# Patient Record
Sex: Female | Born: 1965 | Race: White | Hispanic: No | Smoking: Never smoker
Health system: Southern US, Community
[De-identification: ages and names within clinical notes are randomized; demographics above are authoritative.]

## PROBLEM LIST (undated history)

## (undated) DIAGNOSIS — G4700 Insomnia, unspecified: Secondary | ICD-10-CM

## (undated) DIAGNOSIS — T7840XA Allergy, unspecified, initial encounter: Secondary | ICD-10-CM

## (undated) DIAGNOSIS — J45909 Unspecified asthma, uncomplicated: Secondary | ICD-10-CM

## (undated) DIAGNOSIS — C801 Malignant (primary) neoplasm, unspecified: Secondary | ICD-10-CM

## (undated) HISTORY — DX: Unspecified asthma, uncomplicated: J45.909

## (undated) HISTORY — PX: EYE SURGERY: SHX253

## (undated) HISTORY — DX: Malignant (primary) neoplasm, unspecified: C80.1

## (undated) HISTORY — DX: Allergy, unspecified, initial encounter: T78.40XA

## (undated) HISTORY — PX: ABDOMINAL SURGERY: SHX537

## (undated) HISTORY — PX: APPENDECTOMY: SHX54

## (undated) HISTORY — PX: SKIN BIOPSY: SHX1

---

## 1983-07-13 HISTORY — PX: OVARIAN CYST SURGERY: SHX726

## 2005-07-23 DIAGNOSIS — J309 Allergic rhinitis, unspecified: Secondary | ICD-10-CM | POA: Insufficient documentation

## 2005-10-21 DIAGNOSIS — L719 Rosacea, unspecified: Secondary | ICD-10-CM | POA: Insufficient documentation

## 2006-07-12 HISTORY — PX: MELANOMA EXCISION: SHX5266

## 2007-02-03 DIAGNOSIS — C4360 Malignant melanoma of unspecified upper limb, including shoulder: Secondary | ICD-10-CM | POA: Insufficient documentation

## 2007-03-15 ENCOUNTER — Ambulatory Visit: Payer: Self-pay | Admitting: Family Medicine

## 2007-03-23 ENCOUNTER — Ambulatory Visit: Payer: Self-pay | Admitting: Family Medicine

## 2007-12-26 DIAGNOSIS — J45909 Unspecified asthma, uncomplicated: Secondary | ICD-10-CM | POA: Insufficient documentation

## 2010-11-10 HISTORY — PX: CYST EXCISION: SHX5701

## 2011-04-30 LAB — HM PAP SMEAR: HM PAP: NEGATIVE

## 2013-01-05 ENCOUNTER — Emergency Department: Payer: Self-pay | Admitting: Emergency Medicine

## 2013-01-05 LAB — COMPREHENSIVE METABOLIC PANEL
Alkaline Phosphatase: 58 U/L (ref 50–136)
Anion Gap: 6 — ABNORMAL LOW (ref 7–16)
Bilirubin,Total: 0.4 mg/dL (ref 0.2–1.0)
Calcium, Total: 8.9 mg/dL (ref 8.5–10.1)
Creatinine: 1.04 mg/dL (ref 0.60–1.30)
EGFR (African American): >60
EGFR (Non-African Amer.): >60
Glucose: 141 mg/dL — ABNORMAL HIGH (ref 65–99)
Osmolality: 282 (ref 275–301)
SGPT (ALT): 21 U/L (ref 12–78)
Sodium: 139 mmol/L (ref 136–145)

## 2013-01-05 LAB — CBC WITH DIFFERENTIAL/PLATELET
Basophil #: 0 10*3/uL (ref 0.0–0.1)
Eosinophil #: 0.1 10*3/uL (ref 0.0–0.7)
HCT: 37.1 % (ref 35.0–47.0)
HGB: 12.6 g/dL (ref 12.0–16.0)
Lymphocyte %: 31.5 %
MCH: 30.4 pg (ref 26.0–34.0)
MCV: 90 fL (ref 80–100)
Monocyte #: 0.4 x10 3/mm (ref 0.2–0.9)
Monocyte %: 6.8 %
Neutrophil #: 3.7 10*3/uL (ref 1.4–6.5)
Neutrophil %: 60.1 %
Platelet: 345 10*3/uL (ref 150–440)
RBC: 4.14 10*6/uL (ref 3.80–5.20)
RDW: 12.8 % (ref 11.5–14.5)

## 2013-01-05 LAB — LIPASE, BLOOD: Lipase: 237 U/L (ref 73–393)

## 2013-01-05 LAB — URINALYSIS, COMPLETE
Bacteria: NONE SEEN
Bilirubin,UR: NEGATIVE
Ph: 6 (ref 4.5–8.0)
Specific Gravity: 1.02 (ref 1.003–1.030)

## 2013-01-11 ENCOUNTER — Ambulatory Visit: Payer: Self-pay

## 2014-10-19 LAB — HM COLONOSCOPY: HM Colonoscopy: NORMAL

## 2014-10-29 ENCOUNTER — Ambulatory Visit
Admit: 2014-10-29 | Disposition: A | Discharge status: Home / Self Care | Payer: Self-pay | Attending: Gastroenterology | Admitting: Gastroenterology

## 2014-12-15 ENCOUNTER — Encounter: Payer: Self-pay | Admitting: Emergency Medicine

## 2014-12-15 ENCOUNTER — Emergency Department
Admission: EM | Admit: 2014-12-15 | Discharge: 2014-12-16 | Disposition: A | Discharge status: Home / Self Care | Payer: 59 | Attending: Emergency Medicine | Admitting: Emergency Medicine

## 2014-12-15 DIAGNOSIS — E860 Dehydration: Secondary | ICD-10-CM | POA: Insufficient documentation

## 2014-12-15 DIAGNOSIS — R51 Headache: Secondary | ICD-10-CM | POA: Insufficient documentation

## 2014-12-15 DIAGNOSIS — R799 Abnormal finding of blood chemistry, unspecified: Secondary | ICD-10-CM | POA: Diagnosis present

## 2014-12-15 DIAGNOSIS — N12 Tubulo-interstitial nephritis, not specified as acute or chronic: Secondary | ICD-10-CM | POA: Diagnosis not present

## 2014-12-15 HISTORY — DX: Insomnia, unspecified: G47.00

## 2014-12-15 LAB — URINALYSIS COMPLETE WITH MICROSCOPIC (ARMC ONLY)
BILIRUBIN URINE: NEGATIVE
Glucose, UA: NEGATIVE mg/dL
KETONES UR: NEGATIVE mg/dL
Nitrite: NEGATIVE
Protein, ur: 100 mg/dL — AB
Specific Gravity, Urine: 1.011 (ref 1.005–1.030)
pH: 5 (ref 5.0–8.0)

## 2014-12-15 LAB — COMPREHENSIVE METABOLIC PANEL
ALT: 14 U/L (ref 14–54)
AST: 22 U/L (ref 15–41)
Albumin: 4.1 g/dL (ref 3.5–5.0)
Alkaline Phosphatase: 80 U/L (ref 38–126)
Anion gap: 10 (ref 5–15)
BUN: 23 mg/dL — AB (ref 6–20)
CHLORIDE: 96 mmol/L — AB (ref 101–111)
CO2: 26 mmol/L (ref 22–32)
Calcium: 8.8 mg/dL — ABNORMAL LOW (ref 8.9–10.3)
Creatinine, Ser: 1.69 mg/dL — ABNORMAL HIGH (ref 0.44–1.00)
GFR calc Af Amer: 40 mL/min — ABNORMAL LOW (ref >60)
GFR calc non Af Amer: 34 mL/min — ABNORMAL LOW (ref >60)
Glucose, Bld: 150 mg/dL — ABNORMAL HIGH (ref 65–99)
Potassium: 4 mmol/L (ref 3.5–5.1)
Sodium: 132 mmol/L — ABNORMAL LOW (ref 135–145)
Total Bilirubin: 0.4 mg/dL (ref 0.3–1.2)
Total Protein: 8.2 g/dL — ABNORMAL HIGH (ref 6.5–8.1)

## 2014-12-15 LAB — CBC WITH DIFFERENTIAL/PLATELET
BASOS ABS: 0 10*3/uL (ref 0–0.1)
BASOS PCT: 0 %
Eosinophils Absolute: 0 10*3/uL (ref 0–0.7)
Eosinophils Relative: 0 %
HCT: 36.2 % (ref 35.0–47.0)
Hemoglobin: 12.1 g/dL (ref 12.0–16.0)
LYMPHS ABS: 0.7 10*3/uL — AB (ref 1.0–3.6)
LYMPHS PCT: 5 %
MCH: 31 pg (ref 26.0–34.0)
MCHC: 33.5 g/dL (ref 32.0–36.0)
MCV: 92.3 fL (ref 80.0–100.0)
Monocytes Absolute: 1 10*3/uL — ABNORMAL HIGH (ref 0.2–0.9)
Monocytes Relative: 7 %
NEUTROS ABS: 13.1 10*3/uL — AB (ref 1.4–6.5)
NEUTROS PCT: 88 %
Platelets: 256 10*3/uL (ref 150–440)
RBC: 3.92 MIL/uL (ref 3.80–5.20)
RDW: 12 % (ref 11.5–14.5)
WBC: 14.9 10*3/uL — ABNORMAL HIGH (ref 3.6–11.0)

## 2014-12-15 MED ORDER — SODIUM CHLORIDE 0.9 % IV BOLUS (SEPSIS)
1000.0000 mL | Freq: Once | INTRAVENOUS | Status: AC
  Administered 2014-12-15: 1000 mL via INTRAVENOUS

## 2014-12-15 NOTE — ED Provider Notes (Signed)
[facility] Emergency Department Provider Note  ____________________________________________  Time seen: Approximately 11:10 PM  I have reviewed the triage vital signs and the nursing notes.   HISTORY  Chief Complaint Abnormal Lab    HPI Brandy Hale is a 49 y.o. female who [place]. The patient reports that she has been sick for over a week. She reports that she developed chills 3 days ago with a MAXIMUM TEMPERATURE of 102.5. The patient reports that she was seen yesterday and given a shot for her urinary tract infection. She was told to come back today if she continued to have fevers. The patient reports that she received another shot today but when they checked blood work the doctor was concerned that she was dehydrated and had decreased kidney function so she was told to come into the hospital. The patient reports that she has not been eating much and has been drinking some. The patient does report that she has some back pain and her kidney area right greater than left. The patient denies any episodes of vomiting. She reports that initially for the symptoms she was placed on Cipro and Percocet for pain. The patient reports that her pain is approximately a 5 out of 10 in intensity. She reports that she simply came because her doctor told her to.   Past Medical History  Diagnosis Date  . Insomnia     There are no active problems to display for this patient.   Past Surgical History  Procedure Laterality Date  . Appendectomy    . Abdominal surgery    . Eye surgery    . Skin biopsy      Current Outpatient Rx  Name  Route  Sig  Dispense  Refill  . oxyCODONE-acetaminophen (PERCOCET/ROXICET) 5-325 MG per tablet   Oral   Take 1 tablet by mouth every 4 (four) hours as needed for moderate pain or severe pain.          Marland Kitchen levofloxacin (LEVAQUIN) 750 MG tablet   Oral   Take 1  tablet (750 mg total) by mouth daily.   7 tablet   0   . zolpidem (AMBIEN) 5 MG tablet   Oral   Take 1 tablet by mouth at bedtime as needed for sleep.            Allergies Review of patient's allergies indicates no known allergies.  History reviewed. No pertinent family history.  Social History History  Substance Use Topics  . Smoking status: Never Smoker   . Smokeless tobacco: Not on file  . Alcohol Use: Yes    Review of Systems Constitutional: fever and chills Eyes: No visual changes. ENT: No sore throat. Cardiovascular: Denies chest pain. Respiratory: Denies shortness of breath. Gastrointestinal: Mild abdominal pain.  no vomiting.   Genitourinary:  dysuria. Musculoskeletal:  back pain. Skin: Negative for rash. Neurological: Headaches.  10-point ROS otherwise negative.  ____________________________________________   PHYSICAL EXAM:  VITAL SIGNS: ED Triage Vitals  Enc Vitals Group     BP 12/15/14 1723 106/55 mmHg     Pulse Rate 12/15/14 1723 91     Resp 12/15/14 1723 18     Temp 12/15/14 1728 98.6 F (37 C)     Temp Source 12/15/14 1728 Oral     SpO2 12/15/14 1723 100 %     Weight 12/15/14 1723 102 lb (46.267 kg)     Height 12/15/14 1723  5\' 4"  (1.626 m)     Head Cir --      Peak Flow --      Pain Score 12/15/14 1723 0     Pain Loc --      Pain Edu? --      Excl. in [place]? --     Constitutional: Alert and oriented. Well appearing and in no acute distress. Eyes: Conjunctivae are normal. PERRL. EOMI. Head: Atraumatic. Nose: No congestion/rhinnorhea. Mouth/Throat: Mucous membranes are moist.  Oropharynx non-erythematous. Cardiovascular: Normal rate, regular rhythm. Grossly normal heart sounds.  Good peripheral circulation. Respiratory: Normal respiratory effort.  No retractions. Lungs CTAB. Gastrointestinal: Soft and nontender. No distention. Positive bowel sounds Genitourinary: Deferred Musculoskeletal: Mild bilateral CVA tenderness to  palpation Neurologic:  Normal speech and language. No gross focal neurologic deficits are appreciated.  Skin:  Skin is warm, dry and intact. No rash noted. Psychiatric: Mood and affect are normal.  ____________________________________________   LABS (all labs ordered are listed, but only abnormal results are displayed)  Labs Reviewed  CBC WITH DIFFERENTIAL/PLATELET - Abnormal; Notable for the following:    WBC 14.9 (*)    Neutro Abs 13.1 (*)    Lymphs Abs 0.7 (*)    Monocytes Absolute 1.0 (*)    All other components within normal limits  COMPREHENSIVE METABOLIC PANEL - Abnormal; Notable for the following:    Sodium 132 (*)    Chloride 96 (*)    Glucose, Bld 150 (*)    BUN 23 (*)    Creatinine, Ser 1.69 (*)    Calcium 8.8 (*)    Total Protein 8.2 (*)    GFR calc non Af Amer 34 (*)    GFR calc Af Amer 40 (*)    All other components within normal limits  URINALYSIS COMPLETEWITH MICROSCOPIC ([facility] ONLY) - Abnormal; Notable for the following:    Color, Urine YELLOW (*)    APPearance CLOUDY (*)    Hgb urine dipstick 2+ (*)    Protein, ur 100 (*)    Leukocytes, UA 3+ (*)    Bacteria, UA FEW (*)    Squamous Epithelial / LPF TOO NUMEROUS TO COUNT (*)    All other components within normal limits  URINE CULTURE   ____________________________________________  EKG  None ____________________________________________  RADIOLOGY  None ____________________________________________   PROCEDURES  Procedure(s) performed: None  Critical Care performed: No  ____________________________________________   INITIAL IMPRESSION / ASSESSMENT AND PLAN / ED COURSE  Pertinent labs & imaging results that were available during my care of the patient were reviewed by me and considered in my medical decision making (see chart for details).  The patient is a 49 year old female with a history of cystitis versus kidney infection that she has been having treated her primary care physician.  The patient was sent in today because she has a mildly elevated creatinine. The patient has been receiving antibiotics. I will give the patient liter of normal saline and reassess the patient.  The patient received 2 L of normal saline and a dose of levofloxacin orally. I will discharge the patient home to follow back up with her primary care physician in 2 days. The patient's fever is improved and her heart rate is also improved. ____________________________________________   FINAL CLINICAL IMPRESSION(S) / ED DIAGNOSES  Final diagnoses:  Pyelonephritis  Dehydration      Loney Hering, MD 12/16/14 [phone]

## 2014-12-15 NOTE — ED Notes (Signed)
Pt sent over for further eval of abnormal labs from [facility]. Pt states her wbc and kidney levels abnormal. Pt states has been running a fever.

## 2014-12-16 ENCOUNTER — Telehealth: Payer: Self-pay | Admitting: Family Medicine

## 2014-12-16 MED ORDER — LEVOFLOXACIN 250 MG PO TABS
ORAL_TABLET | ORAL | Status: AC
  Filled 2014-12-16: qty 5

## 2014-12-16 MED ORDER — LEVOFLOXACIN 750 MG PO TABS: 750.0000 mg | ORAL_TABLET | Freq: Every day | ORAL | Status: AC

## 2014-12-16 MED ORDER — SODIUM CHLORIDE 0.9 % IV BOLUS (SEPSIS)
1000.0000 mL | Freq: Once | INTRAVENOUS | Status: AC
  Administered 2014-12-16: 1000 mL via INTRAVENOUS

## 2014-12-16 MED ORDER — LEVOFLOXACIN 750 MG PO TABS
750.0000 mg | ORAL_TABLET | Freq: Once | ORAL | Status: AC
Start: 2014-12-16 — End: 2014-12-16
  Administered 2014-12-16: 750 mg via ORAL

## 2014-12-16 MED ORDER — LEVOFLOXACIN 500 MG PO TABS
ORAL_TABLET | ORAL | Status: AC
  Administered 2014-12-16: 750 mg via ORAL
  Filled 2014-12-16: qty 8

## 2014-12-16 NOTE — Telephone Encounter (Signed)
Pt is scheduled for tomorrow 12/17/14 @ 1:45. Thanks TNP

## 2014-12-16 NOTE — Telephone Encounter (Signed)
Pt was discharged from [facility] ER this morning around 2:30am and needs a f/u. The earliest I could get her in would be Friday 12/20/14. Can you work her in your schedule before then? If so, when? You have a 12 pm same day slot open Thursday 12/19/14. Thanks TNP

## 2014-12-16 NOTE — Telephone Encounter (Signed)
Called to schedule appt for PT. LMTCB Thanks TNP

## 2014-12-16 NOTE — Discharge Instructions (Signed)
Pyelonephritis, Adult Pyelonephritis is a kidney infection. In general, there are 2 main types of pyelonephritis:  Infections that come on quickly without any warning (acute pyelonephritis).  Infections that persist for a long period of time (chronic pyelonephritis). CAUSES  Two main causes of pyelonephritis are:  Bacteria traveling from the bladder to the kidney. This is a problem especially in pregnant women. The urine in the bladder can become filled with bacteria from multiple causes, including:  Inflammation of the prostate gland (prostatitis).  Sexual intercourse in females.  Bladder infection (cystitis).  Bacteria traveling from the bloodstream to the tissue part of the kidney. Problems that may increase your risk of getting a kidney infection include:  Diabetes.  Kidney stones or bladder stones.  Cancer.  Catheters placed in the bladder.  Other abnormalities of the kidney or ureter. SYMPTOMS   Abdominal pain.  Pain in the side or flank area.  Fever.  Chills.  Upset stomach.  Blood in the urine (dark urine).  Frequent urination.  Strong or persistent urge to urinate.  Burning or stinging when urinating. DIAGNOSIS  Your caregiver may diagnose your kidney infection based on your symptoms. A urine sample may also be taken. TREATMENT  In general, treatment depends on how severe the infection is.   If the infection is mild and caught early, your caregiver may treat you with oral antibiotics and send you home.  If the infection is more severe, the bacteria may have gotten into the bloodstream. This will require intravenous (IV) antibiotics and a hospital stay. Symptoms may include:  High fever.  Severe flank pain.  Shaking chills.  Even after a hospital stay, your caregiver may require you to be on oral antibiotics for a period of time.  Other treatments may be required depending upon the cause of the infection. HOME CARE INSTRUCTIONS   Take your  antibiotics as directed. Finish them even if you start to feel better.  Make an appointment to have your urine checked to make sure the infection is gone.  Drink enough fluids to keep your urine clear or pale yellow.  Take medicines for the bladder if you have urgency and frequency of urination as directed by your caregiver. SEEK IMMEDIATE MEDICAL CARE IF:   You have a fever or persistent symptoms for more than 2-3 days.  You have a fever and your symptoms suddenly get worse.  You are unable to take your antibiotics or fluids.  You develop shaking chills.  You experience extreme weakness or fainting.  There is no improvement after 2 days of treatment. MAKE SURE YOU:  Understand these instructions.  Will watch your condition.  Will get help right away if you are not doing well or get worse. Document Released: 06/28/2005 Document Revised: 12/28/2011 Document Reviewed: 12/02/2010 [facility] Patient Information 2015 [place], [place]. This information is not intended to replace advice given to you by your health care provider. Make sure you discuss any questions you have with your health care provider.  Dehydration, Adult Dehydration is when you lose more fluids from the body than you take in. Vital organs like the kidneys, brain, and heart cannot function without a proper amount of fluids and salt. Any loss of fluids from the body can cause dehydration.  CAUSES   Vomiting.  Diarrhea.  Excessive sweating.  Excessive urine output.  Fever. SYMPTOMS  Mild dehydration  Thirst.  Dry lips.  Slightly dry mouth. Moderate dehydration  Very dry mouth.  Sunken eyes.  Skin does not bounce  back quickly when lightly pinched and released.  Dark urine and decreased urine production.  Decreased tear production.  Headache. Severe dehydration  Very dry mouth.  Extreme thirst.  Rapid, weak pulse (more than 100 beats per minute at rest).  Cold hands and feet.  Not able to  sweat in spite of heat and temperature.  Rapid breathing.  Blue lips.  Confusion and lethargy.  Difficulty being awakened.  Minimal urine production.  No tears. DIAGNOSIS  Your caregiver will diagnose dehydration based on your symptoms and your exam. Blood and urine tests will help confirm the diagnosis. The diagnostic evaluation should also identify the cause of dehydration. TREATMENT  Treatment of mild or moderate dehydration can often be done at home by increasing the amount of fluids that you drink. It is best to drink small amounts of fluid more often. Drinking too much at one time can make vomiting worse. Refer to the home care instructions below. Severe dehydration needs to be treated at the hospital where you will probably be given intravenous (IV) fluids that contain water and electrolytes. HOME CARE INSTRUCTIONS   Ask your caregiver about specific rehydration instructions.  Drink enough fluids to keep your urine clear or pale yellow.  Drink small amounts frequently if you have nausea and vomiting.  Eat as you normally do.  Avoid:  Foods or drinks high in sugar.  Carbonated drinks.  Juice.  Extremely hot or cold fluids.  Drinks with caffeine.  Fatty, greasy foods.  Alcohol.  Tobacco.  Overeating.  Gelatin desserts.  Wash your hands well to avoid spreading bacteria and viruses.  Only take over-the-counter or prescription medicines for pain, discomfort, or fever as directed by your caregiver.  Ask your caregiver if you should continue all prescribed and over-the-counter medicines.  Keep all follow-up appointments with your caregiver. SEEK MEDICAL CARE IF:  You have abdominal pain and it increases or stays in one area (localizes).  You have a rash, stiff neck, or severe headache.  You are irritable, sleepy, or difficult to awaken.  You are weak, dizzy, or extremely thirsty. SEEK IMMEDIATE MEDICAL CARE IF:   You are unable to keep fluids down  or you get worse despite treatment.  You have frequent episodes of vomiting or diarrhea.  You have blood or green matter (bile) in your vomit.  You have blood in your stool or your stool looks black and tarry.  You have not urinated in 6 to 8 hours, or you have only urinated a small amount of very dark urine.  You have a fever.  You faint. MAKE SURE YOU:   Understand these instructions.  Will watch your condition.  Will get help right away if you are not doing well or get worse. Document Released: 06/28/2005 Document Revised: 09/20/2011 Document Reviewed: 02/15/2011 [facility] Patient Information 2015 [place], [place]. This information is not intended to replace advice given to you by your health care provider. Make sure you discuss any questions you have with your health care provider.

## 2014-12-16 NOTE — Telephone Encounter (Signed)
Can put her in at 1 :60 tomorrow. Thanks.

## 2014-12-17 ENCOUNTER — Ambulatory Visit (INDEPENDENT_AMBULATORY_CARE_PROVIDER_SITE_OTHER): Payer: 59 | Admitting: Family Medicine

## 2014-12-17 ENCOUNTER — Encounter: Payer: Self-pay | Admitting: Family Medicine

## 2014-12-17 VITALS — BP 106/70 | HR 72 | Temp 98.2°F | Resp 16 | Ht 64.0 in | Wt 107.0 lb

## 2014-12-17 DIAGNOSIS — G4700 Insomnia, unspecified: Secondary | ICD-10-CM | POA: Insufficient documentation

## 2014-12-17 DIAGNOSIS — M2040 Other hammer toe(s) (acquired), unspecified foot: Secondary | ICD-10-CM | POA: Insufficient documentation

## 2014-12-17 DIAGNOSIS — N12 Tubulo-interstitial nephritis, not specified as acute or chronic: Secondary | ICD-10-CM

## 2014-12-17 DIAGNOSIS — N912 Amenorrhea, unspecified: Secondary | ICD-10-CM | POA: Insufficient documentation

## 2014-12-17 DIAGNOSIS — Z833 Family history of diabetes mellitus: Secondary | ICD-10-CM | POA: Insufficient documentation

## 2014-12-17 DIAGNOSIS — I7300 Raynaud's syndrome without gangrene: Secondary | ICD-10-CM | POA: Insufficient documentation

## 2014-12-17 DIAGNOSIS — Z22321 Carrier or suspected carrier of Methicillin susceptible Staphylococcus aureus: Secondary | ICD-10-CM | POA: Insufficient documentation

## 2014-12-17 DIAGNOSIS — R42 Dizziness and giddiness: Secondary | ICD-10-CM | POA: Insufficient documentation

## 2014-12-17 DIAGNOSIS — H8110 Benign paroxysmal vertigo, unspecified ear: Secondary | ICD-10-CM | POA: Insufficient documentation

## 2014-12-17 DIAGNOSIS — Z332 Encounter for elective termination of pregnancy: Secondary | ICD-10-CM | POA: Insufficient documentation

## 2014-12-17 NOTE — Progress Notes (Signed)
   Subjective:    Patient ID: Brandy Hale, female    DOB: March 24, 1966, 49 y.o.   MRN: [id number]  HPI Comments: Pt is coming in today for HFU. Pt was seen on 12/15/2014 at [facility] ED. Pt was diagnosed with pyelonephritis and dehydration. Pt was put on Levofloxacin 750 mg for 7 days. Pt has only taken 1 dose of the abx. States her sx have improved slightly (chills). Pt states that if she drinks a lot of fluids, her right flank becomes tender. Pt states she drinks 2-3 20 oz bottles of water daily.     Review of Systems  Constitutional: Positive for appetite change and fatigue. Negative for fever, chills, diaphoresis, activity change and unexpected weight change.  Respiratory: Negative for cough, chest tightness, shortness of breath, wheezing and stridor.   Cardiovascular: Negative for chest pain, palpitations and leg swelling.  Genitourinary: Positive for frequency and flank pain. Negative for dysuria and difficulty urinating.    Past Medical History  Diagnosis Date  . Insomnia   . Allergy   . Asthma    Past Surgical History  Procedure Laterality Date  . Appendectomy    . Abdominal surgery    . Eye surgery    . Skin biopsy    . Melanoma excision  2008  . Cyst excision Left 11/2010    under armpit  . Ovarian cyst surgery  1985    reports that she has never smoked. She has never used smokeless tobacco. She reports that she drinks alcohol. She reports that she does not use illicit drugs. family history includes Hyperlipidemia in her mother; Hypertension in her mother. No Known Allergies  Current Outpatient Prescriptions on File Prior to Visit  Medication Sig Dispense Refill  . levofloxacin (LEVAQUIN) 750 MG tablet Take 1 tablet (750 mg total) by mouth daily. 7 tablet 0  . oxyCODONE-acetaminophen (PERCOCET/ROXICET) 5-325 MG per tablet Take 1 tablet by mouth every 4 (four) hours as needed for moderate pain or severe pain.     Marland Kitchen zolpidem (AMBIEN) 5 MG tablet Take 1 tablet by mouth  at bedtime as needed for sleep.      No current facility-administered medications on file prior to visit.      Objective:   Physical Exam  Constitutional: She appears well-developed and well-nourished.  Cardiovascular: Normal rate and regular rhythm.   Pulmonary/Chest: Breath sounds normal.  Abdominal: Soft. Bowel sounds are normal. There is CVA tenderness (mild bilateral).     BP 106/70 mmHg  Pulse 72  Temp(Src) 98.2 F (36.8 C) (Oral)  Resp 16  Ht 5\' 4"  (1.626 m)  Wt 107 lb (48.535 kg)  BMI 18.36 kg/m2       Assessment & Plan:     1. Pyelonephritis  Improving but still not feeling great. Continue Levaquin, await culture,  And schedule renal ultrasound secondary history of childhood urine problems and some time of procedure.  Please call back if condition worsens or does not continue to improve.   - COMPLETE METABOLIC PANEL WITH GFR - CBC with Differential/Platelet - US Renal; Future

## 2014-12-18 ENCOUNTER — Telehealth: Payer: Self-pay

## 2014-12-18 LAB — CMP14+EGFR
A/G RATIO: 1.5 (ref 1.1–2.5)
ALK PHOS: 101 IU/L (ref 39–117)
ALT: 25 IU/L (ref 0–32)
AST: 37 IU/L (ref 0–40)
Albumin: 4.2 g/dL (ref 3.5–5.5)
BUN/Creatinine Ratio: 11 (ref 9–23)
BUN: 10 mg/dL (ref 6–24)
Bilirubin Total: 0.3 mg/dL (ref 0.0–1.2)
CO2: 23 mmol/L (ref 18–29)
Calcium: 9.2 mg/dL (ref 8.7–10.2)
Chloride: 97 mmol/L (ref 97–108)
Creatinine, Ser: 0.88 mg/dL (ref 0.57–1.00)
GFR calc Af Amer: 89 mL/min/{1.73_m2} (ref >59)
GFR, EST NON AFRICAN AMERICAN: 77 mL/min/{1.73_m2} (ref >59)
GLOBULIN, TOTAL: 2.8 g/dL (ref 1.5–4.5)
GLUCOSE: 98 mg/dL (ref 65–99)
POTASSIUM: 3.6 mmol/L (ref 3.5–5.2)
Sodium: 139 mmol/L (ref 134–144)
TOTAL PROTEIN: 7 g/dL (ref 6.0–8.5)

## 2014-12-18 LAB — CBC WITH DIFFERENTIAL/PLATELET
Basophils Absolute: 0 10*3/uL (ref 0.0–0.2)
Basos: 0 %
EOS (ABSOLUTE): 0.1 10*3/uL (ref 0.0–0.4)
Eos: 1 %
Hematocrit: 34.5 % (ref 34.0–46.6)
Hemoglobin: 11.6 g/dL (ref 11.1–15.9)
IMMATURE GRANS (ABS): 0 10*3/uL (ref 0.0–0.1)
IMMATURE GRANULOCYTES: 0 %
Lymphocytes Absolute: 1.4 10*3/uL (ref 0.7–3.1)
Lymphs: 23 %
MCH: 30.5 pg (ref 26.6–33.0)
MCHC: 33.6 g/dL (ref 31.5–35.7)
MCV: 91 fL (ref 79–97)
Monocytes Absolute: 0.8 10*3/uL (ref 0.1–0.9)
Monocytes: 13 %
NEUTROS PCT: 63 %
Neutrophils Absolute: 3.8 10*3/uL (ref 1.4–7.0)
PLATELETS: 356 10*3/uL (ref 150–379)
RBC: 3.8 x10E6/uL (ref 3.77–5.28)
RDW: 13 % (ref 12.3–15.4)
WBC: 6 10*3/uL (ref 3.4–10.8)

## 2014-12-18 NOTE — Telephone Encounter (Signed)
Informed pt. Expresses verbal understanding. Renaldo Fiddler, CMA

## 2014-12-18 NOTE — Telephone Encounter (Signed)
LMTCB 12/18/2014  Thanks,   -Mickel Baas

## 2014-12-18 NOTE — Telephone Encounter (Signed)
-----   Message from Margarita Rana, MD sent at 12/18/2014  6:23 AM EDT ----- Labs normal. Good sign that taking the correct medication. Please notify patient.  Thanks.

## 2014-12-23 ENCOUNTER — Ambulatory Visit: Admission: RE | Admit: 2014-12-23 | Payer: 59 | Source: Ambulatory Visit

## 2015-01-01 ENCOUNTER — Ambulatory Visit
Admission: RE | Admit: 2015-01-01 | Discharge: 2015-01-01 | Disposition: A | Discharge status: Home / Self Care | Payer: 59 | Source: Ambulatory Visit | Attending: Family Medicine | Admitting: Family Medicine

## 2015-01-01 DIAGNOSIS — N12 Tubulo-interstitial nephritis, not specified as acute or chronic: Secondary | ICD-10-CM | POA: Insufficient documentation

## 2015-01-03 ENCOUNTER — Telehealth: Payer: Self-pay

## 2015-01-03 ENCOUNTER — Telehealth: Payer: Self-pay | Admitting: Family Medicine

## 2015-01-03 NOTE — Telephone Encounter (Signed)
Pt advised as directed below.   Thanks,   -Maly Lemarr  

## 2015-01-03 NOTE — Telephone Encounter (Signed)
-----   Message from Margarita Rana, MD sent at 01/01/2015  8:41 PM EDT ----- Normal renal ultrasound. Please notify patient. Thanks.

## 2015-01-03 NOTE — Telephone Encounter (Signed)
Pt returning call.  OE#[phone]/YN

## 2015-01-03 NOTE — Telephone Encounter (Signed)
LMTCB 01/03/2015  Thanks,   -Hadrian Yarbrough  

## 2015-05-20 ENCOUNTER — Other Ambulatory Visit: Payer: Self-pay

## 2015-05-20 DIAGNOSIS — G4700 Insomnia, unspecified: Secondary | ICD-10-CM

## 2015-05-22 ENCOUNTER — Other Ambulatory Visit: Payer: Self-pay

## 2015-05-22 DIAGNOSIS — G4700 Insomnia, unspecified: Secondary | ICD-10-CM

## 2015-05-22 MED ORDER — ZOLPIDEM TARTRATE 5 MG PO TABS: 5.0000 mg | ORAL_TABLET | Freq: Every evening | ORAL | Status: DC | PRN

## 2015-05-22 NOTE — Telephone Encounter (Signed)
Printed, please fax or call in to pharmacy. Thank you.   

## 2015-09-16 ENCOUNTER — Encounter: Payer: Self-pay | Admitting: Family Medicine

## 2015-10-01 ENCOUNTER — Encounter: Payer: Self-pay | Admitting: Family Medicine

## 2015-10-01 ENCOUNTER — Ambulatory Visit (INDEPENDENT_AMBULATORY_CARE_PROVIDER_SITE_OTHER): Payer: 59 | Admitting: Family Medicine

## 2015-10-01 VITALS — BP 96/60 | HR 72 | Temp 98.1°F | Resp 16 | Ht 64.0 in | Wt 103.0 lb

## 2015-10-01 DIAGNOSIS — R8290 Unspecified abnormal findings in urine: Secondary | ICD-10-CM

## 2015-10-01 DIAGNOSIS — Z803 Family history of malignant neoplasm of breast: Secondary | ICD-10-CM | POA: Diagnosis not present

## 2015-10-01 DIAGNOSIS — Z124 Encounter for screening for malignant neoplasm of cervix: Secondary | ICD-10-CM | POA: Diagnosis not present

## 2015-10-01 DIAGNOSIS — Z0000 Encounter for general adult medical examination without abnormal findings: Secondary | ICD-10-CM

## 2015-10-01 DIAGNOSIS — R8299 Other abnormal findings in urine: Secondary | ICD-10-CM | POA: Diagnosis not present

## 2015-10-01 LAB — POCT URINALYSIS DIPSTICK
BILIRUBIN UA: NEGATIVE
Blood, UA: NEGATIVE
GLUCOSE UA: NEGATIVE
Ketones, UA: NEGATIVE
Leukocytes, UA: NEGATIVE
Nitrite, UA: POSITIVE
PH UA: 6
Protein, UA: NEGATIVE
SPEC GRAV UA: 1.01
Urobilinogen, UA: 0.2

## 2015-10-01 NOTE — Progress Notes (Signed)
Patient ID: Brandy Hale, female   DOB: [birth year], 49 y.o.   MRN: [id number]       Patient: Brandy Hale, Female    DOB: 02-18-1966, 50 y.o.   MRN: [id number] Visit Date: 10/01/2015  Today's Provider: Margarita Rana, MD   Chief Complaint  Patient presents with  . Annual Exam   Subjective:    Annual physical exam Brandy Hale is a 50 y.o. female who presents today for health maintenance and complete physical. She feels well. She reports exercising daily. She reports she is sleeping fairly well, 6 hours.  09/06/14 CPE 04/30/11 Pap-neg; HPV-neg 08/28/15 Mammogram-BI-RADS 1 10/19/14 Colonoscopy-normal -----------------------------------------------------------------  Patient reports that she has been taking Ambien 5 mg every night and at times takes 1 1/2 tablet. Patient reports that her body may be getting used to it. Patient reports that she gets 4 hours of sleep then tosses and turn the rest of the time.   Patient reports that her urine has been cloudy on and off for a while. Patient denies burning or pain when voiding.  Review of Systems  Constitutional: Negative.   HENT: Negative.   Eyes: Negative.   Respiratory: Negative.   Cardiovascular: Negative.   Gastrointestinal: Negative.   Endocrine: Negative.   Genitourinary: Negative.   Musculoskeletal: Negative.   Skin: Negative.   Allergic/Immunologic: Negative.   Neurological: Negative.   Hematological: Negative.   Psychiatric/Behavioral: Positive for sleep disturbance.    Social History      She  reports that she has never smoked. She has never used smokeless tobacco. She reports that she drinks alcohol. She reports that she does not use illicit drugs.       Social History   Social History  . Marital Status: Divorced    Spouse Name: N/A  . Number of Children: N/A  . Years of Education: N/A   Social History Main Topics  . Smoking status: Never Smoker   . Smokeless tobacco: Never Used  .  Alcohol Use: Yes     Comment: occasional, drinks wine  . Drug Use: No  . Sexual Activity: Not Asked   Other Topics Concern  . None   Social History Narrative    Past Medical History  Diagnosis Date  . Insomnia   . Allergy   . Asthma      Patient Active Problem List   Diagnosis Date Noted  . Absence of menstruation 12/17/2014  . Benign paroxysmal positional nystagmus 12/17/2014  . Dizziness 12/17/2014  . Elective abortion 12/17/2014  . Family history of diabetes mellitus 12/17/2014  . Hammer toe 12/17/2014  . Insomnia 12/17/2014  . 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 Raynaud's syndrome 12/17/2014  . Paroxysmal digital cyanosis 12/17/2014  . Asthma, exogenous 12/26/2007  . 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 Acne erythematosa 10/21/2005  . Allergic rhinitis 07/23/2005    Past Surgical History  Procedure Laterality Date  . Appendectomy    . Abdominal surgery    . Eye surgery    . Skin biopsy    . Melanoma excision  2008  . Cyst excision Left 11/2010    under armpit  . Ovarian cyst surgery  1985    Family History        Family Status  Relation Status Death Age  . Mother Deceased     breast cancer stage 4  . Father Deceased 46    heart trouble        Her family history includes  Hyperlipidemia in her mother; Hypertension in her mother.    No Known Allergies  Previous Medications   MULTIPLE VITAMIN PO    Take 1 tablet by mouth daily.    SPIRONOLACTONE (ALDACTONE) 50 MG TABLET    Take 50 mg by mouth 2 (two) times daily.    ZOLPIDEM (AMBIEN) 5 MG TABLET    Take 1 tablet (5 mg total) by mouth at bedtime as needed for sleep.    Patient Care Team: Margarita Rana, MD as PCP - General (Family Medicine)     Objective:   Vitals: BP 96/60 mmHg  Pulse 72  Temp(Src) 98.1 F (36.7 C) (Oral)  Resp 16  Ht 5\' 4"  (1.626 m)  Wt 103 lb (46.72 kg)  BMI 17.67 kg/m2   Physical Exam  Constitutional: She is oriented to person, place, and time. She  appears well-developed and well-nourished.  HENT:  Head: Normocephalic and atraumatic.  Right Ear: Tympanic membrane, external ear and ear canal normal.  Left Ear: Tympanic membrane, external ear and ear canal normal.  Nose: Nose normal.  Mouth/Throat: Uvula is midline, oropharynx is clear and moist and mucous membranes are normal.  Eyes: Conjunctivae, EOM and lids are normal. Pupils are equal, round, and reactive to light.  Neck: Trachea normal and normal range of motion. Neck supple. Carotid bruit is not present. No thyroid mass and no thyromegaly present.  Cardiovascular: Normal rate, regular rhythm and normal heart sounds.   Pulmonary/Chest: Effort normal and breath sounds normal.  Abdominal: Soft. Normal appearance and bowel sounds are normal. There is no hepatosplenomegaly. There is no tenderness.  Genitourinary: No breast swelling, tenderness or discharge.  Musculoskeletal: Normal range of motion.  Lymphadenopathy:    She has no cervical adenopathy.    She has no axillary adenopathy.  Neurological: She is alert and oriented to person, place, and time. She has normal strength. No cranial nerve deficit.  Skin: Skin is warm, dry and intact.  Psychiatric: She has a normal mood and affect. Her speech is normal and behavior is normal. Judgment and thought content normal. Cognition and memory are normal.     Depression Screen PHQ 2/9 Scores 10/01/2015 12/17/2014  PHQ - 2 Score 0 -  Exception Documentation - Medical reason      Assessment & Plan:     Routine Health Maintenance and Physical Exam  Exercise Activities and Dietary recommendations Goals    None      1. Annual physical exam Stable. Patient advised to continue eating healthy and exercise daily. - POCT urinalysis dipstick - CBC with Differential/Platelet - Comprehensive metabolic panel - Lipid Panel With LDL/HDL Ratio - TSH  2. Cervical cancer screening F/U pending lab report. - Pap IG and HPV (high risk) DNA  detection  3. Family history of breast cancer in mother - CA 125  68. Cloudy urine New problem. F/U pending lab report. - Urine culture  Patient seen and examined by Dr. Jerrell Belfast, and note scribed by Philbert Riser. Dimas, CMA.  I have reviewed the document for accuracy and completeness and I agree with above. Jerrell Belfast, MD   Margarita Rana, MD    --------------------------------------------------------------------

## 2015-10-03 LAB — COMPREHENSIVE METABOLIC PANEL
ALBUMIN: 4.9 g/dL (ref 3.5–5.5)
ALK PHOS: 57 IU/L (ref 39–117)
ALT: 10 IU/L (ref 0–32)
AST: 17 IU/L (ref 0–40)
Albumin/Globulin Ratio: 2 (ref 1.2–2.2)
BILIRUBIN TOTAL: 0.5 mg/dL (ref 0.0–1.2)
BUN / CREAT RATIO: 15 (ref 9–23)
BUN: 12 mg/dL (ref 6–24)
CHLORIDE: 98 mmol/L (ref 96–106)
CO2: 28 mmol/L (ref 18–29)
Calcium: 10.4 mg/dL — ABNORMAL HIGH (ref 8.7–10.2)
Creatinine, Ser: 0.8 mg/dL (ref 0.57–1.00)
GFR calc Af Amer: 99 mL/min/{1.73_m2} (ref >59)
GFR calc non Af Amer: 86 mL/min/{1.73_m2} (ref >59)
GLUCOSE: 92 mg/dL (ref 65–99)
Globulin, Total: 2.5 g/dL (ref 1.5–4.5)
Potassium: 4.9 mmol/L (ref 3.5–5.2)
SODIUM: 141 mmol/L (ref 134–144)
Total Protein: 7.4 g/dL (ref 6.0–8.5)

## 2015-10-03 LAB — CBC WITH DIFFERENTIAL/PLATELET
BASOS ABS: 0 10*3/uL (ref 0.0–0.2)
Basos: 0 %
EOS (ABSOLUTE): 0.1 10*3/uL (ref 0.0–0.4)
Eos: 2 %
HEMOGLOBIN: 13.5 g/dL (ref 11.1–15.9)
Hematocrit: 38.8 % (ref 34.0–46.6)
Immature Grans (Abs): 0 10*3/uL (ref 0.0–0.1)
Immature Granulocytes: 0 %
Lymphocytes Absolute: 2.1 10*3/uL (ref 0.7–3.1)
Lymphs: 36 %
MCH: 30.8 pg (ref 26.6–33.0)
MCHC: 34.8 g/dL (ref 31.5–35.7)
MCV: 89 fL (ref 79–97)
MONOCYTES: 10 %
Monocytes Absolute: 0.6 10*3/uL (ref 0.1–0.9)
NEUTROS ABS: 3 10*3/uL (ref 1.4–7.0)
Neutrophils: 52 %
Platelets: 347 10*3/uL (ref 150–379)
RBC: 4.38 x10E6/uL (ref 3.77–5.28)
RDW: 13.1 % (ref 12.3–15.4)
WBC: 5.7 10*3/uL (ref 3.4–10.8)

## 2015-10-03 LAB — LIPID PANEL WITH LDL/HDL RATIO
CHOLESTEROL TOTAL: 193 mg/dL (ref 100–199)
HDL: 90 mg/dL (ref >39)
LDL Calculated: 87 mg/dL (ref 0–99)
LDl/HDL Ratio: 1 ratio units (ref 0.0–3.2)
TRIGLYCERIDES: 79 mg/dL (ref 0–149)
VLDL Cholesterol Cal: 16 mg/dL (ref 5–40)

## 2015-10-03 LAB — CA 125: CA 125: 10.7 U/mL (ref 0.0–38.1)

## 2015-10-03 LAB — TSH: TSH: 2.04 u[IU]/mL (ref 0.450–4.500)

## 2015-10-05 LAB — URINE CULTURE

## 2015-10-06 ENCOUNTER — Telehealth: Payer: Self-pay

## 2015-10-06 DIAGNOSIS — N390 Urinary tract infection, site not specified: Secondary | ICD-10-CM

## 2015-10-06 LAB — PAP IG AND HPV HIGH-RISK
HPV, high-risk: NEGATIVE
PAP Smear Comment: 0

## 2015-10-06 MED ORDER — NITROFURANTOIN MONOHYD MACRO 100 MG PO CAPS: 100.0000 mg | ORAL_CAPSULE | Freq: Two times a day (BID) | ORAL | Status: DC

## 2015-10-06 NOTE — Telephone Encounter (Signed)
-----   Message from Margarita Rana, MD sent at 10/06/2015  9:53 AM EDT ----- Urine does have bacteria. Please cal patient and see if has any symptoms. Thanks.

## 2015-10-06 NOTE — Telephone Encounter (Signed)
OK. Please send in rx for Nitrofuratoin 100 mg bid for 7 days. Thanks.

## 2015-10-06 NOTE — Telephone Encounter (Signed)
Pt advised.  She reports urinary symptoms come and go.  Today she is having increased symptoms.  She uses [facility].   Thanks,   -Mickel Baas

## 2015-10-06 NOTE — Telephone Encounter (Signed)
Patient advised as below.  

## 2015-10-07 ENCOUNTER — Telehealth: Payer: Self-pay

## 2015-10-07 NOTE — Telephone Encounter (Signed)
Advised pt of lab results. Pt verbally acknowledges understanding. Dantonio Justen Drozdowski, CMA   

## 2015-10-07 NOTE — Telephone Encounter (Signed)
-----   Message from Margarita Rana, MD sent at 10/06/2015 10:36 PM EDT ----- Pap is normal. Please notify patient.

## 2015-10-28 ENCOUNTER — Telehealth: Payer: Self-pay | Admitting: Family Medicine

## 2015-10-28 NOTE — Telephone Encounter (Signed)
Pt stated that she had her labs done 10/02/15 and she was advised that she needed to have them done again in a month. Pt would like to pick up a lab slip. Please advise. Thanks TNP

## 2015-10-31 LAB — COMPREHENSIVE METABOLIC PANEL
ALK PHOS: 61 IU/L (ref 39–117)
ALT: 13 IU/L (ref 0–32)
AST: 20 IU/L (ref 0–40)
Albumin/Globulin Ratio: 2.2 (ref 1.2–2.2)
Albumin: 5 g/dL (ref 3.5–5.5)
BUN/Creatinine Ratio: 12 (ref 9–23)
BUN: 14 mg/dL (ref 6–24)
Bilirubin Total: 0.6 mg/dL (ref 0.0–1.2)
CHLORIDE: 101 mmol/L (ref 96–106)
CO2: 25 mmol/L (ref 18–29)
Calcium: 10.9 mg/dL — ABNORMAL HIGH (ref 8.7–10.2)
Creatinine, Ser: 1.14 mg/dL — ABNORMAL HIGH (ref 0.57–1.00)
GFR calc Af Amer: 64 mL/min/{1.73_m2} (ref >59)
GFR calc non Af Amer: 56 mL/min/{1.73_m2} — ABNORMAL LOW (ref >59)
GLOBULIN, TOTAL: 2.3 g/dL (ref 1.5–4.5)
Glucose: 100 mg/dL — ABNORMAL HIGH (ref 65–99)
POTASSIUM: 6.4 mmol/L — AB (ref 3.5–5.2)
SODIUM: 145 mmol/L — AB (ref 134–144)
Total Protein: 7.3 g/dL (ref 6.0–8.5)

## 2015-11-01 ENCOUNTER — Telehealth: Payer: Self-pay

## 2015-11-01 DIAGNOSIS — E875 Hyperkalemia: Secondary | ICD-10-CM

## 2015-11-01 NOTE — Telephone Encounter (Signed)
Patient advised to have labs recheck on Monday.

## 2015-11-04 ENCOUNTER — Telehealth: Payer: Self-pay

## 2015-11-04 LAB — COMPREHENSIVE METABOLIC PANEL
ALT: 11 IU/L (ref 0–32)
AST: 21 IU/L (ref 0–40)
Albumin/Globulin Ratio: 1.9 (ref 1.2–2.2)
Albumin: 4.8 g/dL (ref 3.5–5.5)
Alkaline Phosphatase: 58 IU/L (ref 39–117)
BILIRUBIN TOTAL: 0.5 mg/dL (ref 0.0–1.2)
BUN/Creatinine Ratio: 13 (ref 9–23)
BUN: 11 mg/dL (ref 6–24)
CALCIUM: 9.9 mg/dL (ref 8.7–10.2)
CHLORIDE: 99 mmol/L (ref 96–106)
CO2: 23 mmol/L (ref 18–29)
Creatinine, Ser: 0.88 mg/dL (ref 0.57–1.00)
GFR calc Af Amer: 88 mL/min/{1.73_m2} (ref >59)
GFR calc non Af Amer: 76 mL/min/{1.73_m2} (ref >59)
GLUCOSE: 95 mg/dL (ref 65–99)
Globulin, Total: 2.5 g/dL (ref 1.5–4.5)
Potassium: 4.8 mmol/L (ref 3.5–5.2)
Sodium: 141 mmol/L (ref 134–144)
Total Protein: 7.3 g/dL (ref 6.0–8.5)

## 2015-11-04 LAB — PTH, INTACT AND CALCIUM: PTH: 43 pg/mL (ref 15–65)

## 2015-11-04 NOTE — Telephone Encounter (Signed)
LMTCB-KW 

## 2015-11-04 NOTE — Telephone Encounter (Signed)
-----   Message from Margarita Rana, MD sent at 11/04/2015  1:27 PM EDT ----- Labs normal. No need for further work up . Thanks.

## 2015-11-05 NOTE — Telephone Encounter (Signed)
Pt called for lab results.  Told pt her labs were normal .  Thanks, Con Memos

## 2015-11-05 NOTE — Telephone Encounter (Signed)
LMTCB 11/05/2015  Thanks,   -Laura  

## 2016-08-30 LAB — HM MAMMOGRAPHY

## 2016-09-01 ENCOUNTER — Encounter: Payer: Self-pay | Admitting: Emergency Medicine

## 2016-09-08 ENCOUNTER — Encounter: Payer: Self-pay | Admitting: Family Medicine

## 2016-10-04 ENCOUNTER — Ambulatory Visit (INDEPENDENT_AMBULATORY_CARE_PROVIDER_SITE_OTHER): Payer: 59 | Admitting: Physician Assistant

## 2016-10-04 ENCOUNTER — Encounter: Payer: Self-pay | Admitting: Physician Assistant

## 2016-10-04 VITALS — BP 108/62 | HR 92 | Temp 99.1°F | Resp 16 | Ht 64.0 in | Wt 110.0 lb

## 2016-10-04 DIAGNOSIS — Z1239 Encounter for other screening for malignant neoplasm of breast: Secondary | ICD-10-CM

## 2016-10-04 DIAGNOSIS — Z1231 Encounter for screening mammogram for malignant neoplasm of breast: Secondary | ICD-10-CM

## 2016-10-04 DIAGNOSIS — J302 Other seasonal allergic rhinitis: Secondary | ICD-10-CM

## 2016-10-04 DIAGNOSIS — Z0000 Encounter for general adult medical examination without abnormal findings: Secondary | ICD-10-CM | POA: Diagnosis not present

## 2016-10-04 DIAGNOSIS — C4361 Malignant melanoma of right upper limb, including shoulder: Secondary | ICD-10-CM

## 2016-10-04 MED ORDER — FLUTICASONE PROPIONATE 50 MCG/ACT NA SUSP: 2.0000 | Freq: Every day | NASAL | 6 refills | Status: DC

## 2016-10-04 NOTE — Patient Instructions (Signed)
Health Maintenance, Female Adopting a healthy lifestyle and getting preventive care can go a long way to promote health and wellness. Talk with your health care provider about what schedule of regular examinations is right for you. This is a good chance for you to check in with your provider about disease prevention and staying healthy. In between checkups, there are plenty of things you can do on your own. Experts have done a lot of research about which lifestyle changes and preventive measures are most likely to keep you healthy. Ask your health care provider for more information. Weight and diet Eat a healthy diet  Be sure to include plenty of vegetables, fruits, low-fat dairy products, and lean protein.  Do not eat a lot of foods high in solid fats, added sugars, or salt.  Get regular exercise. This is one of the most important things you can do for your health.  Most adults should exercise for at least 150 minutes each week. The exercise should increase your heart rate and make you sweat (moderate-intensity exercise).  Most adults should also do strengthening exercises at least twice a week. This is in addition to the moderate-intensity exercise. Maintain a healthy weight  Body mass index (BMI) is a measurement that can be used to identify possible weight problems. It estimates body fat based on height and weight. Your health care provider can help determine your BMI and help you achieve or maintain a healthy weight.  For females 53 years of age and older:  A BMI below 18.5 is considered underweight.  A BMI of 18.5 to 24.9 is normal.  A BMI of 25 to 29.9 is considered overweight.  A BMI of 30 and above is considered obese. Watch levels of cholesterol and blood lipids  You should start having your blood tested for lipids and cholesterol at 53 years of age, then have this test every 5 years.  You may need to have your cholesterol levels checked more often if:  Your lipid or  cholesterol levels are high.  You are older than 53 years of age.  You are at high risk for heart disease. Cancer screening Lung Cancer  Lung cancer screening is recommended for adults 64-42 years old who are at high risk for lung cancer because of a history of smoking.  A yearly low-dose CT scan of the lungs is recommended for people who:  Currently smoke.  Have quit within the past 15 years.  Have at least a 30-pack-year history of smoking. A pack year is smoking an average of one pack of cigarettes a day for 1 year.  Yearly screening should continue until it has been 15 years since you quit.  Yearly screening should stop if you develop a health problem that would prevent you from having lung cancer treatment. Breast Cancer  Practice breast self-awareness. This means understanding how your breasts normally appear and feel.  It also means doing regular breast self-exams. Let your health care provider know about any changes, no matter how small.  If you are in your 20s or 30s, you should have a clinical breast exam (CBE) by a health care provider every 1-3 years as part of a regular health exam.  If you are 34 or older, have a CBE every year. Also consider having a breast X-ray (mammogram) every year.  If you have a family history of breast cancer, talk to your health care provider about genetic screening.  If you are at high risk for breast cancer, talk  to your health care provider about having an MRI and a mammogram every year.  Breast cancer gene (BRCA) assessment is recommended for women who have family members with BRCA-related cancers. BRCA-related cancers include:  Breast.  Ovarian.  Tubal.  Peritoneal cancers.  Results of the assessment will determine the need for genetic counseling and BRCA1 and BRCA2 testing. Cervical Cancer  Your health care provider may recommend that you be screened regularly for cancer of the pelvic organs (ovaries, uterus, and vagina).  This screening involves a pelvic examination, including checking for microscopic changes to the surface of your cervix (Pap test). You may be encouraged to have this screening done every 3 years, beginning at age 24.  For women ages 66-65, health care providers may recommend pelvic exams and Pap testing every 3 years, or they may recommend the Pap and pelvic exam, combined with testing for human papilloma virus (HPV), every 5 years. Some types of HPV increase your risk of cervical cancer. Testing for HPV may also be done on women of any age with unclear Pap test results.  Other health care providers may not recommend any screening for nonpregnant women who are considered low risk for pelvic cancer and who do not have symptoms. Ask your health care provider if a screening pelvic exam is right for you.  If you have had past treatment for cervical cancer or a condition that could lead to cancer, you need Pap tests and screening for cancer for at least 20 years after your treatment. If Pap tests have been discontinued, your risk factors (such as having a new sexual partner) need to be reassessed to determine if screening should resume. Some women have medical problems that increase the chance of getting cervical cancer. In these cases, your health care provider may recommend more frequent screening and Pap tests. Colorectal Cancer  This type of cancer can be detected and often prevented.  Routine colorectal cancer screening usually begins at 53 years of age and continues through 53 years of age.  Your health care provider may recommend screening at an earlier age if you have risk factors for colon cancer.  Your health care provider may also recommend using home test kits to check for hidden blood in the stool.  A small camera at the end of a tube can be used to examine your colon directly (sigmoidoscopy or colonoscopy). This is done to check for the earliest forms of colorectal cancer.  Routine  screening usually begins at age 41.  Direct examination of the colon should be repeated every 5-10 years through 53 years of age. However, you may need to be screened more often if early forms of precancerous polyps or small growths are found. Skin Cancer  Check your skin from head to toe regularly.  Tell your health care provider about any new moles or changes in moles, especially if there is a change in a mole's shape or color.  Also tell your health care provider if you have a mole that is larger than the size of a pencil eraser.  Always use sunscreen. Apply sunscreen liberally and repeatedly throughout the day.  Protect yourself by wearing long sleeves, pants, a wide-brimmed hat, and sunglasses whenever you are outside. Heart disease, diabetes, and high blood pressure  High blood pressure causes heart disease and increases the risk of stroke. High blood pressure is more likely to develop in:  People who have blood pressure in the high end of the normal range (130-139/85-89 mm Hg).  People who are overweight or obese.  People who are African American.  If you are 18-39 years of age, have your blood pressure checked every 3-5 years. If you are 40 years of age or older, have your blood pressure checked every year. You should have your blood pressure measured twice-once when you are at a hospital or clinic, and once when you are not at a hospital or clinic. Record the average of the two measurements. To check your blood pressure when you are not at a hospital or clinic, you can use:  An automated blood pressure machine at a pharmacy.  A home blood pressure monitor.  If you are between 55 years and 79 years old, ask your health care provider if you should take aspirin to prevent strokes.  Have regular diabetes screenings. This involves taking a blood sample to check your fasting blood sugar level.  If you are at a normal weight and have a low risk for diabetes, have this test once  every three years after 53 years of age.  If you are overweight and have a high risk for diabetes, consider being tested at a younger age or more often. Preventing infection Hepatitis B  If you have a higher risk for hepatitis B, you should be screened for this virus. You are considered at high risk for hepatitis B if:  You were born in a country where hepatitis B is common. Ask your health care provider which countries are considered high risk.  Your parents were born in a high-risk country, and you have not been immunized against hepatitis B (hepatitis B vaccine).  You have HIV or AIDS.  You use needles to inject street drugs.  You live with someone who has hepatitis B.  You have had sex with someone who has hepatitis B.  You get hemodialysis treatment.  You take certain medicines for conditions, including cancer, organ transplantation, and autoimmune conditions. Hepatitis C  Blood testing is recommended for:  Everyone born from 1945 through 1965.  Anyone with known risk factors for hepatitis C. Sexually transmitted infections (STIs)  You should be screened for sexually transmitted infections (STIs) including gonorrhea and chlamydia if:  You are sexually active and are younger than 53 years of age.  You are older than 53 years of age and your health care provider tells you that you are at risk for this type of infection.  Your sexual activity has changed since you were last screened and you are at an increased risk for chlamydia or gonorrhea. Ask your health care provider if you are at risk.  If you do not have HIV, but are at risk, it may be recommended that you take a prescription medicine daily to prevent HIV infection. This is called pre-exposure prophylaxis (PrEP). You are considered at risk if:  You are sexually active and do not regularly use condoms or know the HIV status of your partner(s).  You take drugs by injection.  You are sexually active with a partner  who has HIV. Talk with your health care provider about whether you are at high risk of being infected with HIV. If you choose to begin PrEP, you should first be tested for HIV. You should then be tested every 3 months for as long as you are taking PrEP. Pregnancy  If you are premenopausal and you may become pregnant, ask your health care provider about preconception counseling.  If you may become pregnant, take 400 to 800 micrograms (mcg) of folic acid   every day.  If you want to prevent pregnancy, talk to your health care provider about birth control (contraception). Osteoporosis and menopause  Osteoporosis is a disease in which the bones lose minerals and strength with aging. This can result in serious bone fractures. Your risk for osteoporosis can be identified using a bone density scan.  If you are 4 years of age or older, or if you are at risk for osteoporosis and fractures, ask your health care provider if you should be screened.  Ask your health care provider whether you should take a calcium or vitamin D supplement to lower your risk for osteoporosis.  Menopause may have certain physical symptoms and risks.  Hormone replacement therapy may reduce some of these symptoms and risks. Talk to your health care provider about whether hormone replacement therapy is right for you. Follow these instructions at home:  Schedule regular health, dental, and eye exams.  Stay current with your immunizations.  Do not use any tobacco products including cigarettes, chewing tobacco, or electronic cigarettes.  If you are pregnant, do not drink alcohol.  If you are breastfeeding, limit how much and how often you drink alcohol.  Limit alcohol intake to no more than 1 drink per day for nonpregnant women. One drink equals 12 ounces of beer, 5 ounces of wine, or 1 ounces of hard liquor.  Do not use street drugs.  Do not share needles.  Ask your health care provider for help if you need support  or information about quitting drugs.  Tell your health care provider if you often feel depressed.  Tell your health care provider if you have ever been abused or do not feel safe at home. This information is not intended to replace advice given to you by your health care provider. Make sure you discuss any questions you have with your health care provider. Document Released: 01/11/2011 Document Revised: 12/04/2015 Document Reviewed: 04/01/2015 Elsevier Interactive Patient Education  2017 [employer].

## 2016-10-04 NOTE — Progress Notes (Signed)
Patient: Brandy Hale, Female    DOB: 12-31-1965, 51 y.o.   MRN: [id number] Visit Date: 10/04/2016  Today's Provider: Trinna Post, PA-C   Chief Complaint  Patient presents with  . Annual Exam   Subjective:    Annual physical exam Brandy Hale is a 51 y.o. female who presents today for health maintenance and complete physical. She feels fairly well. Pt is c/o left ear fullness. She reports exercising about 2 days per week. Uses a fit board for 30 minutes. She reports she is sleeping fairly well. She used to be on Ambien, but has been out of Rx since Dr. Venia Minks left. She has been taking Melatonin 10 mg twice weekly and an OTC sleep aid twice weekly PRN. Pt is getting between 5-7 hours of sleep per night. Pt reports she does not feel rested after waking when she sleeps for 5 hours. Pt reports her fit bit has informed her that wakes up several times in the night. Pt reports her bed partner advises her that she snores. She is filling out Epworth scale today.   She lives in [place] with [place], her partner of 18 years. Her relationship is going well. She does not have children. 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   She is UTD on health maintenance. Hx of breast ca in mother age 45's. Hx of LEEP procedure 20 years ago, PAPs normal since.   She is in spironolactone from her dermatologist for acne. No longer takes it regularly but rather as needed for breakouts, will take 2 weeks at a time, two pills daily. Sees Dr. Darrick Huntsman at [facility] dermatology for skin checks. 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    Last CPE- 10/01/2015 Last pap- 10/01/2015. WNL. HPV negative. Last mammogram- 08/30/2016 (performed by [facility])- BI-RADS 1 Last colonoscopy- 10/29/2014- Dr. Allen Norris. Entire examined colon is normal. Repeat 10 years unless change in family history or for lower GI  problems. years   -----------------------------------------------------------------   Review of Systems  Constitutional: Negative.   HENT: Negative.        Left ear fullness  Eyes: Negative.   Respiratory: Negative.   Cardiovascular: Negative.   Gastrointestinal: Negative.   Endocrine: Negative.   Genitourinary: Negative.   Musculoskeletal: Negative.   Skin: Negative.   Allergic/Immunologic: Negative.   Neurological: Negative.   Hematological: Negative.   Psychiatric/Behavioral: Positive for sleep disturbance. Negative for agitation, behavioral problems, confusion, decreased concentration, dysphoric mood, hallucinations, self-injury and suicidal ideas. The patient is not nervous/anxious and is not hyperactive.     Social History      She  reports that she has never smoked. She has never used smokeless tobacco. She reports that she drinks alcohol. She reports that she does not use drugs.       Social History   Social History  . Marital status: Divorced    Spouse name: N/A  . Number of children: 0  . Years of education: some college   Occupational History  .  Lab [employer]   Social History Main Topics  . Smoking status: Never Smoker  . Smokeless tobacco: Never Used  . Alcohol use Yes     Comment: occasional, drinks wine  . Drug use: No  . Sexual activity: Yes   Other Topics Concern  . None   Social History Narrative  . None    Past Medical History:  Diagnosis Date  . Allergy   . Asthma   . Insomnia      Patient Active Problem List   Diagnosis Date Noted  . Urinary tract infection 10/06/2015  . Family history of breast cancer in mother 10/01/2015  . Absence of menstruation 12/17/2014  . Benign paroxysmal positional nystagmus 12/17/2014  . Dizziness 12/17/2014  . Elective abortion 12/17/2014  . Family history of diabetes mellitus 12/17/2014  . Hammer toe 12/17/2014  . Insomnia 12/17/2014  . 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 Raynaud's syndrome  12/17/2014  . Paroxysmal digital cyanosis 12/17/2014  . Asthma, exogenous 12/26/2007  . 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 Acne erythematosa 10/21/2005  . Allergic rhinitis 07/23/2005    Past Surgical History:  Procedure Laterality Date  . ABDOMINAL SURGERY    . APPENDECTOMY    . CYST EXCISION Left 11/2010   under armpit  . EYE SURGERY    . MELANOMA EXCISION  2008  . OVARIAN CYST SURGERY  1985  . SKIN BIOPSY      Family History        Family Status  Relation Status  . Mother Deceased   breast cancer stage 4  . Father Deceased at age 78   heart trouble  . Sister Alive  . Brother Deceased   MVA  . Sister Alive   otosclerosis  . Sister Alive   otosclerosis  . Sister Alive  . Brother Deceased   MVA  . Brother Deceased   accidental        Her family history includes Breast cancer in her mother; Heart Problems in her father; Hyperlipidemia in her mother and sister; Hypertension in her mother; Melanoma in her sister; Other in her sister and sister.     No Known Allergies   Current Outpatient Prescriptions:  .  doxylamine, Sleep, (SLEEP AID) 25 MG tablet, Take 25 mg by mouth at bedtime as needed., Disp: , Rfl:  .  loratadine (CLARITIN) 10 MG tablet, Take 10 mg by mouth daily as needed for allergies., Disp: , Rfl:  .  Melatonin 10 MG TABS, Take 1 tablet by mouth at bedtime as needed., Disp: , Rfl:  .  MULTIPLE VITAMIN PO, Take 1 tablet by mouth daily. , Disp: , Rfl:  .  spironolactone (ALDACTONE) 50 MG tablet, Take 50 mg by mouth 2 (two) times daily. , Disp: , Rfl:  .  zolpidem (AMBIEN) 5 MG tablet, Take 1 tablet (5 mg total) by mouth at bedtime as needed for sleep. (Patient not taking: Reported on 10/04/2016), Disp: 90 tablet, Rfl: 1   Patient Care Team: Trinna Post, PA-C as PCP - General (Physician Assistant)      Objective:   Vitals: BP 108/62 (BP Location: Left Arm, Patient Position: Sitting, Cuff Size: Normal)   Pulse 92   Temp 99.1 F (37.3 C)  (Oral)   Resp 16   Ht 5\' 4"  (1.626 m)   Wt 110 lb (49.9 kg)   BMI 18.88 kg/m    Vitals:   10/04/16 1511  BP: 108/62  Pulse: 92  Resp: 16  Temp: 99.1 F (37.3 C)  TempSrc: Oral  Weight: 110 lb (49.9 kg)  Height: 5\' 4"  (1.626 m)     Physical Exam  Constitutional: She appears well-developed and well-nourished. No distress.  HENT:  Right Ear: Tympanic membrane and external ear normal.  Left Ear: Tympanic membrane and external ear normal.  Mouth/Throat: Oropharynx is clear and moist. No oropharyngeal exudate.  Neck: Neck supple. No thyromegaly present.  Cardiovascular: Normal rate and regular rhythm.   Pulmonary/Chest: Effort normal and breath sounds normal.  Abdominal: Soft. Bowel sounds are normal.  Lymphadenopathy:    She has no cervical adenopathy.  Skin: Skin is warm and dry. She is not diaphoretic.  Psychiatric: She has a normal mood and affect. Her behavior is normal.     Depression Screen PHQ 2/9 Scores 10/04/2016 10/01/2015 12/17/2014  PHQ - 2 Score 0 0 -  PHQ- 9 Score 0 - -  Exception Documentation - - Medical reason      Assessment & Plan:     Routine Health Maintenance and Physical Exam  Exercise Activities and Dietary recommendations Goals    None       There is no immunization history on file for this patient.  Health Maintenance  Topic Date Due  . Hepatitis C Screening  [birth year]  . HIV Screening  06/26/1979  . TETANUS/TDAP  06/26/1983  . MAMMOGRAM  08/30/2018  . PAP SMEAR  10/01/2018  . COLONOSCOPY  10/18/2024  . INFLUENZA VACCINE  Addressed     Discussed health benefits of physical activity, and encouraged her to engage in regular exercise appropriate for her age and condition.   1. Annual physical exam  Stable. Insomnia stable, patient filled out Epworth but score within normal range. Not interested in pursuing sleep study at this time, scanned in sheet. Does not want Ambien refilled. Otherwise, stable. Will get labs as below.  -  fluticasone (FLONASE) 50 MCG/ACT nasal spray; Place 2 sprays into both nostrils daily.  Dispense: 16 g; Refill: 6 - Comprehensive metabolic panel - CBC with Differential/Platelet - TSH - Lipid panel  2. Breast cancer screening  Gets mammogram done at [facility] breast center.   - MM Digital Screening; Future  3. Seasonal allergic rhinitis, unspecified chronicity, unspecified trigger  Flonase.   4. 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   Return in about 1 year (around 10/04/2017) for CPE.  The entirety of the information documented in the History of Present Illness, Review of Systems and Physical Exam were personally obtained by me. Portions of this information were initially documented by Raquel Sarna D and reviewed by me for thoroughness and accuracy.         --------------------------------------------------------------------  Patient seen and examined by Trinna Post, PA-C, and note scribed by Renaldo Fiddler, CMA.  Trinna Post, PA-C  [facility]

## 2016-10-09 LAB — CBC WITH DIFFERENTIAL/PLATELET
Basophils Absolute: 0 10*3/uL (ref 0.0–0.2)
Basos: 1 %
EOS (ABSOLUTE): 0.1 10*3/uL (ref 0.0–0.4)
Eos: 1 %
Hematocrit: 38.5 % (ref 34.0–46.6)
Hemoglobin: 13.5 g/dL (ref 11.1–15.9)
Immature Grans (Abs): 0 10*3/uL (ref 0.0–0.1)
Immature Granulocytes: 0 %
Lymphocytes Absolute: 2.3 10*3/uL (ref 0.7–3.1)
Lymphs: 44 %
MCH: 31 pg (ref 26.6–33.0)
MCHC: 35.1 g/dL (ref 31.5–35.7)
MCV: 88 fL (ref 79–97)
Monocytes Absolute: 0.6 10*3/uL (ref 0.1–0.9)
Monocytes: 11 %
Neutrophils Absolute: 2.2 10*3/uL (ref 1.4–7.0)
Neutrophils: 43 %
Platelets: 325 10*3/uL (ref 150–379)
RBC: 4.36 x10E6/uL (ref 3.77–5.28)
RDW: 11.8 % — ABNORMAL LOW (ref 12.3–15.4)
WBC: 5.2 10*3/uL (ref 3.4–10.8)

## 2016-10-09 LAB — COMPREHENSIVE METABOLIC PANEL
ALT: 19 IU/L (ref 0–32)
AST: 23 IU/L (ref 0–40)
Albumin/Globulin Ratio: 1.8 (ref 1.2–2.2)
Albumin: 4.6 g/dL (ref 3.5–5.5)
Alkaline Phosphatase: 71 IU/L (ref 39–117)
BUN/Creatinine Ratio: 22 (ref 9–23)
BUN: 17 mg/dL (ref 6–24)
Bilirubin Total: 0.4 mg/dL (ref 0.0–1.2)
CO2: 26 mmol/L (ref 18–29)
Calcium: 10 mg/dL (ref 8.7–10.2)
Chloride: 101 mmol/L (ref 96–106)
Creatinine, Ser: 0.77 mg/dL (ref 0.57–1.00)
GFR calc Af Amer: 103 mL/min/{1.73_m2} (ref >59)
GFR calc non Af Amer: 89 mL/min/{1.73_m2} (ref >59)
Globulin, Total: 2.6 g/dL (ref 1.5–4.5)
Glucose: 88 mg/dL (ref 65–99)
Potassium: 4.5 mmol/L (ref 3.5–5.2)
Sodium: 144 mmol/L (ref 134–144)
Total Protein: 7.2 g/dL (ref 6.0–8.5)

## 2016-10-09 LAB — LIPID PANEL
Chol/HDL Ratio: 2.3 ratio units (ref 0.0–4.4)
Cholesterol, Total: 202 mg/dL — ABNORMAL HIGH (ref 100–199)
HDL: 87 mg/dL (ref >39)
LDL Calculated: 99 mg/dL (ref 0–99)
Triglycerides: 79 mg/dL (ref 0–149)
VLDL Cholesterol Cal: 16 mg/dL (ref 5–40)

## 2016-10-09 LAB — TSH: TSH: 1.19 u[IU]/mL (ref 0.450–4.500)

## 2017-09-07 LAB — HM MAMMOGRAPHY

## 2017-09-13 ENCOUNTER — Encounter: Payer: Self-pay | Admitting: Physician Assistant

## 2018-10-26 ENCOUNTER — Encounter: Payer: 59 | Admitting: Physician Assistant

## 2018-12-15 ENCOUNTER — Ambulatory Visit: Payer: Managed Care, Other (non HMO) | Admitting: Physician Assistant

## 2018-12-15 ENCOUNTER — Encounter: Payer: Self-pay | Admitting: Physician Assistant

## 2018-12-15 ENCOUNTER — Other Ambulatory Visit: Payer: Self-pay

## 2018-12-15 VITALS — BP 108/69 | HR 84 | Temp 98.2°F | Resp 16 | Ht 64.0 in | Wt 110.6 lb

## 2018-12-15 DIAGNOSIS — Z0000 Encounter for general adult medical examination without abnormal findings: Secondary | ICD-10-CM

## 2018-12-15 DIAGNOSIS — J45909 Unspecified asthma, uncomplicated: Secondary | ICD-10-CM | POA: Diagnosis not present

## 2018-12-15 DIAGNOSIS — Z1239 Encounter for other screening for malignant neoplasm of breast: Secondary | ICD-10-CM | POA: Diagnosis not present

## 2018-12-15 DIAGNOSIS — Z23 Encounter for immunization: Secondary | ICD-10-CM

## 2018-12-15 MED ORDER — WIXELA INHUB 250-50 MCG/DOSE IN AEPB: INHALATION_SPRAY | RESPIRATORY_TRACT | 11 refills | Status: DC

## 2018-12-15 MED ORDER — MONTELUKAST SODIUM 10 MG PO TABS: 10.0000 mg | ORAL_TABLET | Freq: Every day | ORAL | 3 refills | Status: DC

## 2018-12-15 NOTE — Progress Notes (Signed)
Patient: Brandy Hale, Female    DOB: 12-Apr-1966, 53 y.o.   MRN: [id number] Visit Date: 12/15/2018  Today's Provider: Trinna Post, PA-C   Chief Complaint  Patient presents with  . Annual Exam   Subjective:    Annual physical exam Brandy Hale is a 53 y.o. female who presents today for health maintenance and complete physical. She feels well. She reports exercising. She reports she is sleeping poorly. Continues to work at [employer].   Last Reported Pap- 10/01/15, no menstrual cycle for a couple years  Mammogram-09/07/17, family history of breast cancer in mother Tdap- Due Today Colonoscopy-10/19/14  Asthma: Reports she had her wixela inhaler refilled, says she is taking it every other day.   She has her biometric screening with [facility] in the next week and would like to get her labwork there.  -----------------------------------------------------------------   Review of Systems  All other systems reviewed and are negative.   Social History She  reports that she has never smoked. She has never used smokeless tobacco. She reports current alcohol use. She reports that she does not use drugs. Social History   Socioeconomic History  . Marital status: Divorced    Spouse name: Not on file  . Number of children: 0  . Years of education: some college  . Highest education level: Not on file  Occupational History    Employer: [employer]  . Financial resource strain: Not on file  . Food insecurity:    Worry: Not on file    Inability: Not on file  . Transportation needs:    Medical: Not on file    Non-medical: Not on file  Tobacco Use  . Smoking status: Never Smoker  . Smokeless tobacco: Never Used  Substance and Sexual Activity  . Alcohol use: Yes    Comment: occasional, drinks wine  . Drug use: No  . Sexual activity: Yes  Lifestyle  . Physical activity:    Days per week: Not on file    Minutes per session: Not on file  . Stress: Not  on file  Relationships  . Social connections:    Talks on phone: Not on file    Gets together: Not on file    Attends religious service: Not on file    Active member of club or organization: Not on file    Attends meetings of clubs or organizations: Not on file    Relationship status: Not on file  Other Topics Concern  . Not on file  Social History Narrative  . Not on file    Patient Active Problem List   Diagnosis Date Noted  . Family history of breast cancer in mother 10/01/2015  . Benign paroxysmal positional nystagmus 12/17/2014  . Dizziness 12/17/2014  . Elective abortion 12/17/2014  . Family history of diabetes mellitus 12/17/2014  . Hammer toe 12/17/2014  . Insomnia 12/17/2014  . 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 Raynaud's syndrome 12/17/2014  . Paroxysmal digital cyanosis 12/17/2014  . Asthma, exogenous 12/26/2007  . 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 Acne erythematosa 10/21/2005  . Allergic rhinitis 07/23/2005    Past Surgical History:  Procedure Laterality Date  . ABDOMINAL SURGERY    . APPENDECTOMY    . CYST EXCISION Left 11/2010   under armpit  . EYE SURGERY    . MELANOMA EXCISION  2008  . OVARIAN CYST SURGERY  1985  . SKIN BIOPSY  Family History  Family Status  Relation Name Status  . Mother  Deceased       breast cancer stage 4  . Father  Deceased at age 23       heart trouble  . Sister  Alive  . Brother  Deceased       MVA  . Sister  Alive       otosclerosis  . Sister  Alive       otosclerosis  . Sister  Alive  . Brother  Deceased       MVA  . Brother  Deceased       accidental   Her family history includes Breast cancer in her mother; Heart Problems in her father; Hyperlipidemia in her mother and sister; Hypertension in her mother; Melanoma in her sister; Other in her sister and sister.     No Known Allergies  Previous Medications   DOXYLAMINE, SLEEP, (SLEEP AID) 25 MG TABLET    Take 25 mg by mouth at bedtime as  needed.   FLUTICASONE (FLONASE) 50 MCG/ACT NASAL SPRAY    Place 2 sprays into both nostrils daily.   LORATADINE (CLARITIN) 10 MG TABLET    Take 10 mg by mouth daily as needed for allergies.   MELATONIN 10 MG TABS    Take 1 tablet by mouth at bedtime as needed.   MULTIPLE VITAMIN PO    Take 1 tablet by mouth daily.     Patient Care Team: [name] as PCP - General (Physician Assistant)      Objective:   Vitals: BP 108/69   Pulse 84   Temp 98.2 F (36.8 C) (Oral)   Resp 16   Ht 5\' 4"  (1.626 m)   Wt 110 lb 9.6 oz (50.2 kg)   BMI 18.98 kg/m    Physical Exam Constitutional:      Appearance: Normal appearance.  Cardiovascular:     Rate and Rhythm: Normal rate and regular rhythm.     Heart sounds: Normal heart sounds.  Pulmonary:     Effort: Pulmonary effort is normal.     Breath sounds: Normal breath sounds.  Abdominal:     General: Abdomen is flat.     Palpations: Abdomen is soft.  Skin:    General: Skin is warm and dry.  Neurological:     Mental Status: She is alert and oriented to person, place, and time. Mental status is at baseline.  Psychiatric:        Mood and Affect: Mood normal.        Behavior: Behavior normal.      Depression Screen PHQ 2/9 Scores 12/15/2018 10/04/2016 10/01/2015 12/17/2014  PHQ - 2 Score 0 0 0 -  PHQ- 9 Score 2 0 - -  Exception Documentation - - - Medical reason      Assessment & Plan:     Routine Health Maintenance and Physical Exam  Exercise Activities and Dietary recommendations Goals   None     Immunization History  Administered Date(s) Administered  . Influenza-Unspecified 06/10/2017  . Tdap 12/15/2018    Health Maintenance  Topic Date Due  . Hepatitis C Screening  1965-08-28  . HIV Screening  06/26/1979  . TETANUS/TDAP  06/26/1983  . INFLUENZA VACCINE  02/10/2019  . MAMMOGRAM  09/08/2019  . PAP SMEAR-Modifier  09/30/2020  . COLONOSCOPY  10/18/2024     Discussed health benefits of physical  activity, and encouraged her to engage in regular exercise appropriate  for her age and condition.    1. Annual physical exam  - Tdap vaccine greater than or equal to 7yo IM  2. Screening for breast cancer  - MM Digital Screening; Future  3. Extrinsic asthma without complication, unspecified asthma severity, unspecified whether persistent  - montelukast (SINGULAIR) 10 MG tablet; Take 1 tablet (10 mg total) by mouth daily.  Dispense: 90 tablet; Refill: 3 - WIXELA INHUB 250-50 MCG/DOSE AEPB; INHALE 1 PUFF BY MOUTH TWICE A DAY  Dispense: 60 each; Refill: 11  The entirety of the information documented in the History of Present Illness, Review of Systems and Physical Exam were personally obtained by me. Portions of this information were initially documented by Lynford Humphrey, CMA and reviewed by me for thoroughness and accuracy.   F/u 1 year CPE -------------------------------------------------------------------- I,Berthold Glace M Eliazar Olivar,acting as a scribe for Trinna Post, PA-C.,have documented all relevant documentation on the behalf of Trinna Post, PA-C,as directed by  Trinna Post, PA-C while in the presence of Trinna Post, PA-C.

## 2018-12-15 NOTE — Patient Instructions (Signed)

## 2018-12-18 ENCOUNTER — Other Ambulatory Visit: Payer: Self-pay | Admitting: Physician Assistant

## 2018-12-18 DIAGNOSIS — Z1231 Encounter for screening mammogram for malignant neoplasm of breast: Secondary | ICD-10-CM

## 2018-12-22 LAB — LIPID PANEL
Cholesterol: 216 — AB (ref 0–200)
HDL: 73 — AB (ref 35–70)
LDL Cholesterol: 128
Triglycerides: 73 (ref 40–160)

## 2018-12-22 LAB — HEMOGLOBIN A1C: Hemoglobin A1C: 5.7

## 2018-12-22 LAB — BASIC METABOLIC PANEL
Creatinine: 0.9 (ref <=1.1)
Glucose: 91

## 2019-01-29 ENCOUNTER — Other Ambulatory Visit: Payer: Self-pay

## 2019-01-29 ENCOUNTER — Ambulatory Visit
Admission: RE | Admit: 2019-01-29 | Discharge: 2019-01-29 | Disposition: A | Discharge status: Home / Self Care | Payer: Managed Care, Other (non HMO) | Source: Ambulatory Visit | Attending: Physician Assistant | Admitting: Physician Assistant

## 2019-01-29 DIAGNOSIS — Z1231 Encounter for screening mammogram for malignant neoplasm of breast: Secondary | ICD-10-CM | POA: Diagnosis not present

## 2019-02-06 ENCOUNTER — Telehealth: Payer: Self-pay

## 2019-02-06 NOTE — Telephone Encounter (Signed)
-----   Message from Mar Daring, [place] sent at 02/05/2019 12:23 PM EDT ----- Normal mammogram. Repeat screening in one year.

## 2019-02-06 NOTE — Telephone Encounter (Signed)
Patient notified of mammogram results.

## 2019-04-05 ENCOUNTER — Encounter: Payer: Self-pay | Admitting: Physician Assistant

## 2019-04-05 ENCOUNTER — Ambulatory Visit
Admission: RE | Admit: 2019-04-05 | Discharge: 2019-04-05 | Disposition: A | Discharge status: Home / Self Care | Payer: Managed Care, Other (non HMO) | Source: Ambulatory Visit | Attending: Physician Assistant | Admitting: Physician Assistant

## 2019-04-05 ENCOUNTER — Ambulatory Visit: Payer: Managed Care, Other (non HMO) | Admitting: Physician Assistant

## 2019-04-05 ENCOUNTER — Other Ambulatory Visit: Payer: Self-pay

## 2019-04-05 VITALS — BP 110/69 | HR 90 | Temp 97.1°F | Wt 109.0 lb

## 2019-04-05 DIAGNOSIS — C4361 Malignant melanoma of right upper limb, including shoulder: Secondary | ICD-10-CM | POA: Diagnosis not present

## 2019-04-05 DIAGNOSIS — M7541 Impingement syndrome of right shoulder: Secondary | ICD-10-CM | POA: Diagnosis not present

## 2019-04-05 DIAGNOSIS — F4321 Adjustment disorder with depressed mood: Secondary | ICD-10-CM

## 2019-04-05 MED ORDER — ESCITALOPRAM OXALATE 10 MG PO TABS: 10.0000 mg | ORAL_TABLET | Freq: Every day | ORAL | 1 refills | Status: DC

## 2019-04-05 MED ORDER — PREDNISONE 10 MG PO TABS: ORAL_TABLET | ORAL | 0 refills | Status: DC

## 2019-04-05 NOTE — Patient Instructions (Signed)
Secondary Shoulder Impingement Syndrome  Shoulder impingement syndrome is a condition that causes pain when connective tissues (tendons) surrounding the shoulder joint become pinched. These tendons are part of the group of muscles and tissues that help to stabilize the shoulder (rotator cuff). Secondary impingement syndrome occurs when movement of the shoulder joint is abnormal. This can happen if there is too much movement, too little movement (stiffness), or abnormal movement. What are the causes? This condition may be caused by:  Shoulder blade muscles that are weak or uncoordinated (scapular dyskinesis).  Glenohumeral instability. This happens when there is too much movement of the upper arm bone and can result from: ? Having loose joints. ? An injury that happened during repeated overhead arm movements, such as throwing.  A hard, direct hit to the shoulder. This is rare. What increases the risk? You may be more likely to develop this condition if you have injured your shoulder in the past or if you are an athlete who participates in:  Sports that involve throwing, such as baseball.  Tennis.  Swimming.  Volleyball. What are the signs or symptoms? The main symptom of this condition is pain on the front or side of the shoulder. Pain may:  Get worse when lifting or raising your arm.  Get worse at night.  Wake you up from sleeping.  Feel sharp when your shoulder is moved, and then fade to an ache. Other symptoms include:  Tenderness.  Stiffness.  Inability to raise the arm above shoulder level or behind the body.  Weakness. How is this diagnosed? This condition is diagnosed based on your symptoms, your medical history, and a physical exam. You may also have imaging studies such as:  X-rays.  MRI.  Ultrasound. How is this treated? Treatment for this condition may include:  Resting your shoulder and avoiding all activities that cause pain or put stress on the  shoulder.  Putting ice on your shoulder.  Taking NSAIDs, such as ibuprofen, to help reduce pain and swelling.  Having injections of medicines to numb the area and reduce inflammation.  Doing exercises to help you regain movement (physical therapy).  Having surgery. This may be needed if nonsurgical treatments do not help. Surgery may involve stabilizing your shoulder and repairing your rotator cuff, if needed. Follow these instructions at home: Managing pain, stiffness, and swelling      If directed, put ice on the injured area. ? Put ice in a plastic bag. ? Place a towel between your skin and the bag. ? Leave the ice on for 20 minutes, 2-3 times a day.  If directed, apply heat to the affected area as often as told by your health care provider. Use the heat source that your health care provider recommends, such as a moist heat pack or a heating pad. ? Place a towel between your skin and the heat source. ? Leave the heat on for 20-30 minutes. ? Remove the heat if your skin turns bright red. This is especially important if you are unable to feel pain, heat, or cold. You may have a greater risk of getting burned. Activity  Rest and return to your normal activities as told by your health care provider. Ask your health care provider what activities are safe for you.  Do exercises as told by your health care provider.  Ask your health care provider when it is safe for you to drive. General instructions  Take over-the-counter and prescription medicines only as told by your health care  provider.  Do not use any products that contain nicotine or tobacco, such as cigarettes, e-cigarettes, and chewing tobacco. If you need help quitting, ask your health care provider.  Keep all follow-up visits as told by your health care provider. This is important. How is this prevented?  Give your body time to rest between periods of activity.  Maintain physical fitness, including strength in your  shoulder muscles and back muscles. Contact a health care provider if:  Your symptoms have not improved after 2-3 months of treatment.  Your symptoms are getting worse. Summary  Shoulder impingement syndrome is a condition that causes pain when connective tissues (tendons) surrounding the shoulder joint become pinched.  Secondary impingement syndrome occurs when movement of the shoulder joint is abnormal.  The main symptom of this condition is pain on the front or side of the shoulder.  It is treated with rest, ice, medicines, physical therapy, and surgery as needed. This information is not intended to replace advice given to you by your health care provider. Make sure you discuss any questions you have with your health care provider. Document Released: 06/28/2005 Document Revised: 10/20/2018 Document Reviewed: 08/30/2018 Elsevier Patient Education  2020 [employer].

## 2019-04-05 NOTE — Progress Notes (Signed)
Patient: Brandy Hale Female    DOB: 01-03-1966   53 y.o.   MRN: [id number] Visit Date: 04/05/2019  Today's Provider: Trinna Post, PA-C   Chief Complaint  Patient presents with  . Shoulder Pain    started about 2-3 weeks ago.   Subjective:     Shoulder Pain  The pain is present in the right shoulder. This is a new problem. The current episode started 1 to 4 weeks ago. The problem occurs constantly. The problem has been gradually worsening. The quality of the pain is described as aching. Pertinent negatives include no fever, inability to bear weight, itching, joint locking, joint swelling, limited range of motion, numbness, stiffness or tingling. She has tried NSAIDS for the symptoms. The treatment provided mild relief.   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 She is right hand dominant and uses the mouse all day. No sports. No heavy lifting. She has used ibuprofen 600 mg BID for a couple of weeks with moderate relief. No issues dropping things. She has some numbness in her right hand which can wake her up when she is sleeping.   Patient reports that she has a close relation going through the final stages of cancer. She has been very upset about this and would like a medication to help her function throughout the day. She was previously on lexapro.   No Known Allergies   Current Outpatient Medications:  .  doxylamine, Sleep, (SLEEP AID) 25 MG tablet, Take 25 mg by mouth at bedtime as needed., Disp: , Rfl:  .  fluticasone (FLONASE) 50 MCG/ACT nasal spray, Place 2 sprays into both nostrils daily., Disp: 16 g, Rfl: 6 .  loratadine (CLARITIN) 10 MG tablet, Take 10 mg by mouth daily as needed for allergies., Disp: , Rfl:  .  Melatonin 10 MG TABS, Take 1 tablet by mouth at bedtime as needed., Disp: , Rfl:  .  montelukast (SINGULAIR) 10 MG tablet, Take 1 tablet (10 mg total) by mouth daily., Disp: 90 tablet, Rfl: 3 .  MULTIPLE VITAMIN PO, Take 1 tablet by mouth  daily. , Disp: , Rfl:  .  WIXELA INHUB 250-50 MCG/DOSE AEPB, INHALE 1 PUFF BY MOUTH TWICE A DAY, Disp: 60 each, Rfl: 11  Review of Systems  Constitutional: Negative for activity change, appetite change, chills, diaphoresis, fatigue, fever and unexpected weight change.  Musculoskeletal: Positive for arthralgias. Negative for back pain, gait problem, joint swelling, myalgias, neck pain, neck stiffness and stiffness.  Skin: Negative for itching.  Neurological: Negative for tingling and numbness.    Social History   Tobacco Use  . Smoking status: Never Smoker  . Smokeless tobacco: Never Used  Substance Use Topics  . Alcohol use: Yes    Comment: occasional, drinks wine      Objective:   BP 110/69 (BP Location: Left Arm, Patient Position: Sitting, Cuff Size: Normal)   Pulse 90   Temp (!) 97.1 F (36.2 C) (Temporal)   Wt 109 lb (49.4 kg)   BMI 18.71 kg/m  Vitals:   04/05/19 1122  BP: 110/69  Pulse: 90  Temp: (!) 97.1 F (36.2 C)  TempSrc: Temporal  Weight: 109 lb (49.4 kg)  Body mass index is 18.71 kg/m.   Physical Exam Constitutional:      Appearance: Normal appearance. She is normal weight.  Musculoskeletal:     Right shoulder: She exhibits crepitus and pain. She exhibits normal range of motion, no tenderness, no  bony tenderness and no deformity.     Comments: Belly press positive on right side. Remaining impingement tests negative bilaterally. Strength 5/5 bilateral upper extremities.   Skin:    General: Skin is warm and dry.  Neurological:     Mental Status: She is alert and oriented to person, place, and time. Mental status is at baseline.  Psychiatric:        Mood and Affect: Mood normal.        Behavior: Behavior normal.      No results found for any visits on 04/05/19.     Assessment & Plan    1. Impingement syndrome of right shoulder  Counseled on stepwise approach. She has already tried NSAIDs. Will get xray and do steroid taper. If ineffective, she  may return to clinic for steroid injection.   - DG Shoulder Right; Future - predniSONE (DELTASONE) 10 MG tablet; Take 6 pills on day 1, 5 pills on day 2, 4 pills on day 3 and so on until complete.  Dispense: 21 tablet; Refill: 0  2. Grief  Will restart Lexapro. Counseled it will take ~ 4 weeks to work.   - escitalopram (LEXAPRO) 10 MG tablet; Take 1 tablet (10 mg total) by mouth daily.  Dispense: 90 tablet; Refill: 1  3. 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 Portions of this information were initially documented by Ashley Royalty, CMA and reviewed by me for thoroughness and accuracy.   F/u PRN      Trinna Post, PA-C  [facility]

## 2019-05-21 ENCOUNTER — Telehealth: Payer: Self-pay

## 2019-05-21 NOTE — Telephone Encounter (Signed)
LVMTRC 

## 2019-05-21 NOTE — Telephone Encounter (Signed)
Patient states that she passed a kidney stone over the weekend and has been having a fever of 99.1-99.9 alone with left kidney pain. Patient states that she is needed a referral placed to the urology. Please advise.

## 2019-05-21 NOTE — Telephone Encounter (Signed)
She needs to be seen. She may do a virtual visit here or go to an urgent care. She may have an infection of her urinary tract or kidneys. A urology referral is not appropriate nor timely enough in this situation.

## 2019-05-21 NOTE — Telephone Encounter (Signed)
Pt calling back.  Please call pt back at [phone] for Adriana's reply asap.  Thanks, [employer]

## 2019-05-22 ENCOUNTER — Ambulatory Visit (INDEPENDENT_AMBULATORY_CARE_PROVIDER_SITE_OTHER): Payer: Managed Care, Other (non HMO) | Admitting: Physician Assistant

## 2019-05-22 DIAGNOSIS — N12 Tubulo-interstitial nephritis, not specified as acute or chronic: Secondary | ICD-10-CM

## 2019-05-22 DIAGNOSIS — N200 Calculus of kidney: Secondary | ICD-10-CM | POA: Diagnosis not present

## 2019-05-22 MED ORDER — CIPROFLOXACIN HCL 500 MG PO TABS: 500.0000 mg | ORAL_TABLET | Freq: Two times a day (BID) | ORAL | 0 refills | Status: AC

## 2019-05-22 NOTE — Telephone Encounter (Signed)
Patient was advised. Patient does not have video capability. Scheduled a telephone visit instead at 10:40 am this morning.

## 2019-05-22 NOTE — Patient Instructions (Signed)

## 2019-05-22 NOTE — Progress Notes (Signed)
Patient: Brandy Hale Female    DOB: 31-Jan-1966   53 y.o.   MRN: [id number] Visit Date: 05/22/2019  Today's Provider: Trinna Post, PA-C   Chief Complaint  Patient presents with  . Headache   Subjective:    Virtual Visit via Telephone Note  I connected with Trinady Parrillo on 05/22/19 at 10:40 AM EST by telephone and verified that I am speaking with the correct person using two identifiers.  Location: Patient: Home Provider: Office   I discussed the limitations, risks, security and privacy concerns of performing an evaluation and management service by telephone and the availability of in person appointments. I also discussed with the patient that there may be a patient responsible charge related to this service. The patient expressed understanding and agreed to proceed.  Patient with history of kidney stones and pyelonephritis.   Headache  The current episode started in the past 7 days. The problem occurs constantly. The pain is located in the frontal region. The pain does not radiate. The pain quality is not similar to prior headaches. The quality of the pain is described as sharp. The pain is at a severity of 5/10. The pain is moderate. Pertinent negatives include no blurred vision, loss of balance, sinus pressure, visual change or weakness. The symptoms are aggravated by caffeine withdrawal. She has tried NSAIDs for the symptoms.  Patient states that she was having left side pain but it seem to have improved a little. Patient also states that she is having fevers from 101.5 lastnight to 100.8 this morning.  Passed kidney stone on Saturday and has visualized stone. Comes from her left kidney which is where it is the sorest. Feels poorly to cough. Reports some nausea but never threw up. Denies diarrhea or blood in stool. Did [employer]live with employer and got macrobid.   No Known Allergies   Current Outpatient Medications:  .  doxylamine, Sleep, (SLEEP AID) 25 MG  tablet, Take 25 mg by mouth at bedtime as needed., Disp: , Rfl:  .  escitalopram (LEXAPRO) 10 MG tablet, Take 1 tablet (10 mg total) by mouth daily., Disp: 90 tablet, Rfl: 1 .  fluticasone (FLONASE) 50 MCG/ACT nasal spray, Place 2 sprays into both nostrils daily., Disp: 16 g, Rfl: 6 .  loratadine (CLARITIN) 10 MG tablet, Take 10 mg by mouth daily as needed for allergies., Disp: , Rfl:  .  Melatonin 10 MG TABS, Take 1 tablet by mouth at bedtime as needed., Disp: , Rfl:  .  montelukast (SINGULAIR) 10 MG tablet, Take 1 tablet (10 mg total) by mouth daily., Disp: 90 tablet, Rfl: 3 .  MULTIPLE VITAMIN PO, Take 1 tablet by mouth daily. , Disp: , Rfl:  .  predniSONE (DELTASONE) 10 MG tablet, Take 6 pills on day 1, 5 pills on day 2, 4 pills on day 3 and so on until complete., Disp: 21 tablet, Rfl: 0 .  WIXELA INHUB 250-50 MCG/DOSE AEPB, INHALE 1 PUFF BY MOUTH TWICE A DAY, Disp: 60 each, Rfl: 11  Review of Systems  HENT: Negative for sinus pressure.   Eyes: Negative for blurred vision.  Neurological: Positive for headaches. Negative for weakness and loss of balance.    Social History   Tobacco Use  . Smoking status: Never Smoker  . Smokeless tobacco: Never Used  Substance Use Topics  . Alcohol use: Yes    Comment: occasional, drinks wine      Objective:   There were  no vitals taken for this visit. There were no vitals filed for this visit.There is no height or weight on file to calculate BMI.   Physical Exam   No results found for any visits on 05/22/19.     Assessment & Plan    1. Pyelonephritis   Will switch to ciprofloxacin to cover for pyelonephritis. Return precautions counseled as she may need to be seen in person if symptoms aren't improving.   - ciprofloxacin (CIPRO) 500 MG tablet; Take 1 tablet (500 mg total) by mouth 2 (two) times daily for 7 days.  Dispense: 14 tablet; Refill: 0  2. Kidney stone    I discussed the assessment and treatment plan with the patient. The  patient was provided an opportunity to ask questions and all were answered. The patient agreed with the plan and demonstrated an understanding of the instructions.   The patient was advised to call back or seek an in-person evaluation if the symptoms worsen or if the condition fails to improve as anticipated.  I provided 25 minutes of non-face-to-face time during this encounter.  The entirety of the information documented in the History of Present Illness, Review of Systems and Physical Exam were personally obtained by me. Portions of this information were initially documented by [facility], CMA and reviewed by me for thoroughness and accuracy.         Trinna Post, PA-C  [facility]

## 2019-11-17 ENCOUNTER — Other Ambulatory Visit: Payer: Self-pay | Admitting: Physician Assistant

## 2019-11-17 DIAGNOSIS — J45909 Unspecified asthma, uncomplicated: Secondary | ICD-10-CM

## 2019-11-17 NOTE — Telephone Encounter (Signed)
Requested Prescriptions  Pending Prescriptions Disp Refills  . WIXELA INHUB 250-50 MCG/DOSE AEPB [Pharmacy Med Name: [facility]  IN[facility]] 180 each 0    Sig: USE 1 INHALATION BY MOUTH  TWICE DAILY     Pulmonology:  Combination Products Passed - 11/17/2019  9:15 PM      Passed - Valid encounter within last 12 months    Recent Outpatient Visits          5 months ago Pyelonephritis   [place], [place], PA-C   7 months ago Impingement syndrome of right shoulder   [facility] Carles Collet M, [place]   11 months ago Annual physical exam   [facility] Trinna Post, [place]   3 years ago Annual physical exam   [facility] Carles Collet M, [place]   4 years ago Annual physical exam   [facility] Margarita Rana, MD

## 2020-01-16 ENCOUNTER — Other Ambulatory Visit: Payer: Self-pay | Admitting: Physician Assistant

## 2020-01-16 DIAGNOSIS — Z1231 Encounter for screening mammogram for malignant neoplasm of breast: Secondary | ICD-10-CM

## 2020-01-31 ENCOUNTER — Ambulatory Visit
Admission: RE | Admit: 2020-01-31 | Discharge: 2020-01-31 | Disposition: A | Discharge status: Home / Self Care | Payer: Managed Care, Other (non HMO) | Source: Ambulatory Visit | Attending: Physician Assistant | Admitting: Physician Assistant

## 2020-01-31 DIAGNOSIS — Z1231 Encounter for screening mammogram for malignant neoplasm of breast: Secondary | ICD-10-CM | POA: Insufficient documentation

## 2020-02-06 ENCOUNTER — Other Ambulatory Visit: Payer: Self-pay | Admitting: Physician Assistant

## 2020-02-06 DIAGNOSIS — J45909 Unspecified asthma, uncomplicated: Secondary | ICD-10-CM

## 2020-08-08 NOTE — Progress Notes (Signed)
Established patient visit   Patient: Brandy Hale   DOB: 1965/12/24   55 y.o. Female  MRN: [id number] Visit Date: 08/11/2020  Today's healthcare provider: Trinna Post, PA-C   Chief Complaint  Patient presents with  . Dysuria   Subjective    Dysuria  This is a recurrent problem. The current episode started 1 to 4 weeks ago. The problem has been unchanged. There has been no fever. Associated symptoms include frequency and urgency. Pertinent negatives include no chills, discharge, flank pain, hematuria, hesitancy, nausea, possible pregnancy, sweats or vomiting. She has tried antibiotics for the symptoms. The treatment provided mild relief. Her past medical history is significant for recurrent UTIs.    She reports she was seen by online doctor that is part of her employer, she took Macrobid 100 mg BID x 5 days and reports it did not work.   Reports she kept her nephew last weekend when he was unwell. He recently tested positive for the flu. She has a runny nose and is coughing through the night.      Medications: Outpatient Medications Prior to Visit  Medication Sig  . doxylamine, Sleep, (UNISOM) 25 MG tablet Take 25 mg by mouth at bedtime as needed.  . fluticasone (FLONASE) 50 MCG/ACT nasal spray Place 2 sprays into both nostrils daily.  Marland Kitchen loratadine (CLARITIN) 10 MG tablet Take 10 mg by mouth daily as needed for allergies.  . Melatonin 10 MG TABS Take 1 tablet by mouth at bedtime as needed.  . MULTIPLE VITAMIN PO Take 1 tablet by mouth daily.   Grant Ruts INHUB 250-50 MCG/DOSE AEPB USE 1 INHALATION BY MOUTH  TWICE DAILY  . escitalopram (LEXAPRO) 10 MG tablet Take 1 tablet (10 mg total) by mouth daily.  . montelukast (SINGULAIR) 10 MG tablet Take 1 tablet (10 mg total) by mouth daily.  . [DISCONTINUED] predniSONE (DELTASONE) 10 MG tablet Take 6 pills on day 1, 5 pills on day 2, 4 pills on day 3 and so on until complete.   No facility-administered medications prior to  visit.    Review of Systems  Constitutional: Negative for chills.  Gastrointestinal: Negative for nausea and vomiting.  Genitourinary: Positive for dysuria, frequency and urgency. Negative for flank pain, hematuria and hesitancy.       Objective    There were no vitals taken for this visit.    Physical Exam Constitutional:      Appearance: Normal appearance.  Pulmonary:     Effort: Pulmonary effort is normal. No respiratory distress.  Neurological:     Mental Status: She is alert.  Psychiatric:        Mood and Affect: Mood normal.        Behavior: Behavior normal.       Results for orders placed or performed in visit on 08/11/20  Urine Culture   Specimen: Urine, Clean Catch   Urine  Result Value Ref Range   Urine Culture, Routine Preliminary report (A)    Organism ID, Bacteria Gram negative rods (A)    ORGANISM ID, BACTERIA Comment   POCT urinalysis dipstick  Result Value Ref Range   Color, UA yellow    Clarity, UA clear    Glucose, UA Negative Negative   Bilirubin, UA negative    Ketones, UA negative    Spec Grav, UA <=1.005 (A) 1.010 - 1.025   Blood, UA negative    pH, UA 7.5 5.0 - 8.0   Protein, UA  Negative Negative   Urobilinogen, UA 0.2 0.2 or 1.0 E.U./dL   Nitrite, UA positive    Leukocytes, UA Large (3+) (A) Negative   Appearance     Odor      Assessment & Plan    1. Dysuria  - POCT urinalysis dipstick  2. Cystitis  - sulfamethoxazole-trimethoprim (BACTRIM DS) 800-160 MG tablet; Take 1 tablet by mouth 2 (two) times daily for 3 days.  Dispense: 6 tablet; Refill: 0  3. Cervical cancer screening  Will have CMA schedule for CPE and PAP.  4. Runny nose  - COVID-19, Flu A+B and RSV   Return if symptoms worsen or fail to improve.      ITrinna Post, PA-C, have reviewed all documentation for this visit. The documentation on 08/13/20 for the exam, diagnosis, procedures, and orders are all accurate and complete.  The entirety of the  information documented in the History of Present Illness, Review of Systems and Physical Exam were personally obtained by me. Portions of this information were initially documented by [facility] and reviewed by me for thoroughness and accuracy.     [name]  [facility] [phone] (phone) [phone] (fax)  [place]

## 2020-08-11 ENCOUNTER — Other Ambulatory Visit: Payer: Self-pay

## 2020-08-11 ENCOUNTER — Telehealth (INDEPENDENT_AMBULATORY_CARE_PROVIDER_SITE_OTHER): Payer: Managed Care, Other (non HMO) | Admitting: Physician Assistant

## 2020-08-11 ENCOUNTER — Encounter: Payer: Self-pay | Admitting: Physician Assistant

## 2020-08-11 DIAGNOSIS — R0989 Other specified symptoms and signs involving the circulatory and respiratory systems: Secondary | ICD-10-CM

## 2020-08-11 DIAGNOSIS — Z124 Encounter for screening for malignant neoplasm of cervix: Secondary | ICD-10-CM

## 2020-08-11 DIAGNOSIS — N3090 Cystitis, unspecified without hematuria: Secondary | ICD-10-CM | POA: Diagnosis not present

## 2020-08-11 DIAGNOSIS — R300 Dysuria: Secondary | ICD-10-CM

## 2020-08-11 LAB — POCT URINALYSIS DIPSTICK
Bilirubin, UA: NEGATIVE
Blood, UA: NEGATIVE
Glucose, UA: NEGATIVE
Ketones, UA: NEGATIVE
Nitrite, UA: POSITIVE
Protein, UA: NEGATIVE
Spec Grav, UA: <=1.005 — AB (ref 1.010–1.025)
Urobilinogen, UA: 0.2 E.U./dL
pH, UA: 7.5 (ref 5.0–8.0)

## 2020-08-11 MED ORDER — SULFAMETHOXAZOLE-TRIMETHOPRIM 800-160 MG PO TABS: 1.0000 | ORAL_TABLET | Freq: Two times a day (BID) | ORAL | 0 refills | Status: AC

## 2020-08-12 ENCOUNTER — Other Ambulatory Visit: Payer: Self-pay | Admitting: Physician Assistant

## 2020-08-12 DIAGNOSIS — J45909 Unspecified asthma, uncomplicated: Secondary | ICD-10-CM

## 2020-08-12 NOTE — Telephone Encounter (Signed)
  Notes to clinic:  script has expired  Review for use and refill    Requested Prescriptions  Pending Prescriptions Disp Refills   montelukast (SINGULAIR) 10 MG tablet [Pharmacy Med Name: Montelukast Sodium 10 MG Oral Tablet] 90 tablet 3    Sig: TAKE 1 TABLET BY MOUTH  DAILY      Pulmonology:  Leukotriene Inhibitors Passed - 08/12/2020 12:20 PM      Passed - Valid encounter within last 12 months    Recent Outpatient Visits           [place] Trinna Post, [place]   1 year ago Pyelonephritis   [place], [place], [place]   1 year ago Impingement syndrome of right shoulder   [facility] Trinna Post, [place]   1 year ago Annual physical exam   [facility] Trinna Post, [place]   3 years ago Annual physical exam   [facility] Carles Collet M, [place]

## 2020-08-14 LAB — URINE CULTURE

## 2020-11-04 ENCOUNTER — Encounter: Payer: Self-pay | Admitting: Family Medicine

## 2020-11-04 ENCOUNTER — Ambulatory Visit (INDEPENDENT_AMBULATORY_CARE_PROVIDER_SITE_OTHER): Payer: Managed Care, Other (non HMO) | Admitting: Family Medicine

## 2020-11-04 ENCOUNTER — Other Ambulatory Visit: Payer: Self-pay

## 2020-11-04 VITALS — BP 113/72 | HR 85 | Temp 98.8°F | Ht 64.0 in | Wt 107.0 lb

## 2020-11-04 DIAGNOSIS — J45909 Unspecified asthma, uncomplicated: Secondary | ICD-10-CM

## 2020-11-04 DIAGNOSIS — Z124 Encounter for screening for malignant neoplasm of cervix: Secondary | ICD-10-CM | POA: Diagnosis not present

## 2020-11-04 DIAGNOSIS — Z0000 Encounter for general adult medical examination without abnormal findings: Secondary | ICD-10-CM

## 2020-11-04 MED ORDER — WIXELA INHUB 250-50 MCG/DOSE IN AEPB: 1.0000 | INHALATION_SPRAY | Freq: Every day | RESPIRATORY_TRACT | 4 refills | Status: DC

## 2020-11-04 NOTE — Progress Notes (Signed)
Complete physical exam   Patient: Brandy Hale   DOB: [birth year]   55 y.o. Female  MRN: [id number] Visit Date: 11/04/2020  Today's healthcare provider: Laurita Quint Leanza Shepperson, FNP   Chief Complaint  Patient presents with  . Annual Exam   Subjective    Brandy Hale is a 55 y.o. female who presents today for a complete physical exam.  She reports consuming a general diet. The patient does not participate in regular exercise at present. She generally feels well. She reports sleeping fairly well. She does not have additional problems to discuss today.   HPI  Overall doing well Sees the dentist and eye doctor regularly Has been having issues of decreased libido   Past Medical History:  Diagnosis Date  . Allergy   . Asthma   . Cancer ([place])    melanoma  . Insomnia    Past Surgical History:  Procedure Laterality Date  . ABDOMINAL SURGERY    . APPENDECTOMY    . CYST EXCISION Left 11/2010   under armpit  . EYE SURGERY    . MELANOMA EXCISION  2008  . OVARIAN CYST SURGERY  1985  . SKIN BIOPSY     Social History   Socioeconomic History  . Marital status: Divorced    Spouse name: Not on file  . Number of children: 0  . Years of education: some college  . Highest education level: Not on file  Occupational History    Employer: [employer]  Tobacco Use  . Smoking status: Never Smoker  . Smokeless tobacco: Never Used  Vaping Use  . Vaping Use: Never used  Substance and Sexual Activity  . Alcohol use: Yes    Comment: occasional, drinks wine  . Drug use: No  . Sexual activity: Yes  Other Topics Concern  . Not on file  Social History Narrative  . Not on file   Social Determinants of Health   Financial Resource Strain: Not on file  Food Insecurity: Not on file  Transportation Needs: Not on file  Physical Activity: Not on file  Stress: Not on file  Social Connections: Not on file  Intimate Partner Violence: Not on file   Family Status  Relation Name Status   . Mother  Deceased       breast cancer stage 4  . Father  Deceased at age 10       heart trouble  . Sister  Alive  . Brother  Deceased       MVA  . Sister  Alive       otosclerosis  . Sister  Alive       otosclerosis  . Sister  Alive  . Brother  Deceased       MVA  . Brother  Deceased       accidental   Family History  Problem Relation Age of Onset  . Hypertension Mother   . Hyperlipidemia Mother   . Breast cancer Mother 74  . Heart Problems Father   . Hyperlipidemia Sister   . Other Sister   . Other Sister   . Melanoma Sister    No Known Allergies  Patient Care Team: [name] as PCP - General (Physician Assistant)   Medications: Outpatient Medications Prior to Visit  Medication Sig  . Biotin 10 MG TABS Take by mouth.  . Cranberry 1000 MG CAPS Take by mouth.  . ferrous sulfate 324 MG TBEC Take 324 mg by mouth.  Marland Kitchen  loratadine (CLARITIN) 10 MG tablet Take 10 mg by mouth daily as needed for allergies.  . Melatonin 10 MG TABS Take 1 tablet by mouth at bedtime as needed.  . montelukast (SINGULAIR) 10 MG tablet TAKE 1 TABLET BY MOUTH  DAILY  . MULTIPLE VITAMIN PO Take 1 tablet by mouth daily.   . [DISCONTINUED] WIXELA INHUB 250-50 MCG/DOSE AEPB USE 1 INHALATION BY MOUTH  TWICE DAILY  . [DISCONTINUED] doxylamine, Sleep, (UNISOM) 25 MG tablet Take 25 mg by mouth at bedtime as needed.  . [DISCONTINUED] escitalopram (LEXAPRO) 10 MG tablet Take 1 tablet (10 mg total) by mouth daily.  . [DISCONTINUED] fluticasone (FLONASE) 50 MCG/ACT nasal spray Place 2 sprays into both nostrils daily.   No facility-administered medications prior to visit.    Review of Systems  Constitutional: Negative.   HENT: Negative.   Eyes: Negative.   Respiratory: Negative.   Cardiovascular: Negative.   Gastrointestinal: Negative.   Endocrine: Negative.   Genitourinary: Negative.   Musculoskeletal: Negative.   Skin: Negative.   Allergic/Immunologic: Negative.   Neurological:  Negative.   Hematological: Negative.   Psychiatric/Behavioral: Negative.       Objective    BP 113/72 (BP Location: Left Arm, Patient Position: Sitting, Cuff Size: Normal)   Pulse 85   Temp 98.8 F (37.1 C) (Oral)   Ht 5\' 4"  (1.626 m)   Wt 107 lb (48.5 kg)   BMI 18.37 kg/m    Physical Exam Vitals reviewed.  Constitutional:      Appearance: Normal appearance.  HENT:     Right Ear: Tympanic membrane and ear canal normal.     Left Ear: Tympanic membrane and ear canal normal.     Nose: Nose normal.  Eyes:     Extraocular Movements: Extraocular movements intact.     Pupils: Pupils are equal, round, and reactive to light.  Neck:     Thyroid: No thyroid mass, thyromegaly or thyroid tenderness.  Cardiovascular:     Rate and Rhythm: Normal rate and regular rhythm.     Pulses: Normal pulses.     Heart sounds: Normal heart sounds.  Pulmonary:     Effort: Pulmonary effort is normal.     Breath sounds: Normal breath sounds.  Abdominal:     General: Bowel sounds are normal.     Palpations: Abdomen is soft.  Musculoskeletal:        General: Normal range of motion.     Cervical back: Normal range of motion.  Lymphadenopathy:     Cervical: No cervical adenopathy.  Skin:    General: Skin is warm and dry.     Capillary Refill: Capillary refill takes less than 2 seconds.  Neurological:     General: No focal deficit present.     Mental Status: She is alert and oriented to person, place, and time.  Psychiatric:        Mood and Affect: Mood normal.        Behavior: Behavior normal.     Pelvic exam: normal external genitalia, vulva, vagina, cervix, uterus and adnexa, VULVA: normal appearing vulva with no masses, tenderness or lesions, VAGINA: normal appearing vagina with normal color and discharge, no lesions, CERVIX: normal appearing cervix without discharge or lesions, UTERUS: uterus is normal size, shape, consistency and nontender, ADNEXA: normal adnexa in size, nontender and no  masses, RECTAL: normal rectal, no masses, PAP: Pap smear done today, thin-prep method, DNA probe for chlamydia and GC obtained, HPV test, exam chaperoned by Mickel Baas,  CMA.  Last depression screening scores PHQ 2/9 Scores 11/04/2020 12/15/2018 10/04/2016  PHQ - 2 Score 0 0 0  PHQ- 9 Score 2 2 0  Exception Documentation - - -   Last fall risk screening Fall Risk  11/04/2020  Falls in the past year? 0  Number falls in past yr: 0  Injury with Fall? 0  Risk for fall due to : No Fall Risks  Follow up Falls evaluation completed   Last Audit-C alcohol use screening Alcohol Use Disorder Test (AUDIT) 11/04/2020  1. How often do you have a drink containing alcohol? 1  2. How many drinks containing alcohol do you have on a typical day when you are drinking? 0  3. How often do you have six or more drinks on one occasion? 0  AUDIT-C Score 1   A score of 3 or more in women, and 4 or more in men indicates increased risk for alcohol abuse, EXCEPT if all of the points are from question 1   No results found for any visits on 11/04/20.  Assessment & Plan    Routine Health Maintenance and Physical Exam  Exercise Activities and Dietary recommendations Goals   None     Immunization History  Administered Date(s) Administered  . Influenza,inj,Quad PF,6+ Mos 06/15/2019  . Influenza-Unspecified 06/10/2017  . Tdap 12/15/2018    Health Maintenance  Topic Date Due  . COVID-19 Vaccine (1) Never done  . HIV Screening  Never done  . PAP SMEAR-Modifier  09/30/2020  . INFLUENZA VACCINE  02/09/2021  . MAMMOGRAM  01/30/2022  . COLONOSCOPY (Pts 45-93yrs Insurance coverage will need to be confirmed)  10/18/2024  . TETANUS/TDAP  12/14/2028  . Hepatitis C Screening  Completed  . HPV VACCINES  Aged Out    Discussed health benefits of physical activity, and encouraged her to engage in regular exercise appropriate for her age and condition.  Problem List Items Addressed This Visit      Respiratory   Asthma,  exogenous   Relevant Medications   WIXELA INHUB 250-50 MCG/DOSE AEPB    Other Visit Diagnoses    Annual physical exam    -  Primary   Relevant Orders   Hepatitis C antibody   HIV Antibody (routine testing w rflx)   CBC with Differential   Comprehensive metabolic panel   Lipid Panel   TSH   Vitamin D, 25-hydroxy   Encounter for Papanicolaou smear for cervical cancer screening       Relevant Orders   Cytology - PAP([place])      Return in about 1 year (around 11/04/2021).      [place], [place] [phone] (phone) [phone] (fax)  [place]

## 2020-11-04 NOTE — Patient Instructions (Signed)
Health Maintenance, Female Adopting a healthy lifestyle and getting preventive care are important in promoting health and wellness. Ask your health care provider about:  The right schedule for you to have regular tests and exams.  Things you can do on your own to prevent diseases and keep yourself healthy. What should I know about diet, weight, and exercise? Eat a healthy diet  Eat a diet that includes plenty of vegetables, fruits, low-fat dairy products, and lean protein.  Do not eat a lot of foods that are high in solid fats, added sugars, or sodium.   Maintain a healthy weight Body mass index (BMI) is used to identify weight problems. It estimates body fat based on height and weight. Your health care provider can help determine your BMI and help you achieve or maintain a healthy weight. Get regular exercise Get regular exercise. This is one of the most important things you can do for your health. Most adults should:  Exercise for at least 150 minutes each week. The exercise should increase your heart rate and make you sweat (moderate-intensity exercise).  Do strengthening exercises at least twice a week. This is in addition to the moderate-intensity exercise.  Spend less time sitting. Even light physical activity can be beneficial. Watch cholesterol and blood lipids Have your blood tested for lipids and cholesterol at 57 years of age, then have this test every 5 years. Have your cholesterol levels checked more often if:  Your lipid or cholesterol levels are high.  You are older than 57 years of age.  You are at high risk for heart disease. What should I know about cancer screening? Depending on your health history and family history, you may need to have cancer screening at various ages. This may include screening for:  Breast cancer.  Cervical cancer.  Colorectal cancer.  Skin cancer.  Lung cancer. What should I know about heart disease, diabetes, and high blood  pressure? Blood pressure and heart disease  High blood pressure causes heart disease and increases the risk of stroke. This is more likely to develop in people who have high blood pressure readings, are of African descent, or are overweight.  Have your blood pressure checked: ? Every 3-5 years if you are 18-39 years of age. ? Every year if you are 40 years old or older. Diabetes Have regular diabetes screenings. This checks your fasting blood sugar level. Have the screening done:  Once every three years after age 40 if you are at a normal weight and have a low risk for diabetes.  More often and at a younger age if you are overweight or have a high risk for diabetes. What should I know about preventing infection? Hepatitis B If you have a higher risk for hepatitis B, you should be screened for this virus. Talk with your health care provider to find out if you are at risk for hepatitis B infection. Hepatitis C Testing is recommended for:  Everyone born from 1945 through 1965.  Anyone with known risk factors for hepatitis C. Sexually transmitted infections (STIs)  Get screened for STIs, including gonorrhea and chlamydia, if: ? You are sexually active and are younger than 57 years of age. ? You are older than 57 years of age and your health care provider tells you that you are at risk for this type of infection. ? Your sexual activity has changed since you were last screened, and you are at increased risk for chlamydia or gonorrhea. Ask your health care provider   if you are at risk.  Ask your health care provider about whether you are at high risk for HIV. Your health care provider may recommend a prescription medicine to help prevent HIV infection. If you choose to take medicine to prevent HIV, you should first get tested for HIV. You should then be tested every 3 months for as long as you are taking the medicine. Pregnancy  If you are about to stop having your period (premenopausal) and  you may become pregnant, seek counseling before you get pregnant.  Take 400 to 800 micrograms (mcg) of folic acid every day if you become pregnant.  Ask for birth control (contraception) if you want to prevent pregnancy. Osteoporosis and menopause Osteoporosis is a disease in which the bones lose minerals and strength with aging. This can result in bone fractures. If you are 65 years old or older, or if you are at risk for osteoporosis and fractures, ask your health care provider if you should:  Be screened for bone loss.  Take a calcium or vitamin D supplement to lower your risk of fractures.  Be given hormone replacement therapy (HRT) to treat symptoms of menopause. Follow these instructions at home: Lifestyle  Do not use any products that contain nicotine or tobacco, such as cigarettes, e-cigarettes, and chewing tobacco. If you need help quitting, ask your health care provider.  Do not use street drugs.  Do not share needles.  Ask your health care provider for help if you need support or information about quitting drugs. Alcohol use  Do not drink alcohol if: ? Your health care provider tells you not to drink. ? You are pregnant, may be pregnant, or are planning to become pregnant.  If you drink alcohol: ? Limit how much you use to 0-1 drink a day. ? Limit intake if you are breastfeeding.  Be aware of how much alcohol is in your drink. In the [place] one drink equals one 12 oz bottle of beer (355 mL), one 5 oz glass of wine (148 mL), or one 1 oz glass of hard liquor (44 mL). General instructions  Schedule regular health, dental, and eye exams.  Stay current with your vaccines.  Tell your health care provider if: ? You often feel depressed. ? You have ever been abused or do not feel safe at home. Summary  Adopting a healthy lifestyle and getting preventive care are important in promoting health and wellness.  Follow your health care provider's instructions about healthy  diet, exercising, and getting tested or screened for diseases.  Follow your health care provider's instructions on monitoring your cholesterol and blood pressure. This information is not intended to replace advice given to you by your health care provider. Make sure you discuss any questions you have with your health care provider. Document Revised: 06/21/2018 Document Reviewed: 06/21/2018 Elsevier Patient Education  2021 Elsevier Inc.  

## 2020-11-10 LAB — IGP, APTIMA HPV
HPV Aptima: NEGATIVE
PAP Smear Comment: 0

## 2020-11-13 LAB — LIPID PANEL
Chol/HDL Ratio: 2.9 ratio (ref 0.0–4.4)
Cholesterol, Total: 216 mg/dL — ABNORMAL HIGH (ref 100–199)
HDL: 75 mg/dL (ref >39)
LDL Chol Calc (NIH): 127 mg/dL — ABNORMAL HIGH (ref 0–99)
Triglycerides: 82 mg/dL (ref 0–149)
VLDL Cholesterol Cal: 14 mg/dL (ref 5–40)

## 2020-11-13 LAB — COMPREHENSIVE METABOLIC PANEL
ALT: 10 IU/L (ref 0–32)
AST: 18 IU/L (ref 0–40)
Albumin/Globulin Ratio: 2.1 (ref 1.2–2.2)
Albumin: 4.5 g/dL (ref 3.8–4.9)
Alkaline Phosphatase: 68 IU/L (ref 44–121)
BUN/Creatinine Ratio: 23 (ref 9–23)
BUN: 17 mg/dL (ref 6–24)
Bilirubin Total: 0.3 mg/dL (ref 0.0–1.2)
CO2: 24 mmol/L (ref 20–29)
Calcium: 9.3 mg/dL (ref 8.7–10.2)
Chloride: 101 mmol/L (ref 96–106)
Creatinine, Ser: 0.75 mg/dL (ref 0.57–1.00)
Globulin, Total: 2.1 g/dL (ref 1.5–4.5)
Glucose: 81 mg/dL (ref 65–99)
Potassium: 5 mmol/L (ref 3.5–5.2)
Sodium: 140 mmol/L (ref 134–144)
Total Protein: 6.6 g/dL (ref 6.0–8.5)
eGFR: 93 mL/min/{1.73_m2} (ref >59)

## 2020-11-13 LAB — CBC WITH DIFFERENTIAL/PLATELET
Basophils Absolute: 0 10*3/uL (ref 0.0–0.2)
Basos: 1 %
EOS (ABSOLUTE): 0.1 10*3/uL (ref 0.0–0.4)
Eos: 1 %
Hematocrit: 38.5 % (ref 34.0–46.6)
Hemoglobin: 13.1 g/dL (ref 11.1–15.9)
Immature Grans (Abs): 0 10*3/uL (ref 0.0–0.1)
Immature Granulocytes: 0 %
Lymphocytes Absolute: 2 10*3/uL (ref 0.7–3.1)
Lymphs: 38 %
MCH: 30.9 pg (ref 26.6–33.0)
MCHC: 34 g/dL (ref 31.5–35.7)
MCV: 91 fL (ref 79–97)
Monocytes Absolute: 0.5 10*3/uL (ref 0.1–0.9)
Monocytes: 9 %
Neutrophils Absolute: 2.6 10*3/uL (ref 1.4–7.0)
Neutrophils: 51 %
Platelets: 345 10*3/uL (ref 150–450)
RBC: 4.24 x10E6/uL (ref 3.77–5.28)
RDW: 12.3 % (ref 11.7–15.4)
WBC: 5.2 10*3/uL (ref 3.4–10.8)

## 2020-11-13 LAB — VITAMIN D 25 HYDROXY (VIT D DEFICIENCY, FRACTURES): Vit D, 25-Hydroxy: 33 ng/mL (ref 30.0–100.0)

## 2020-11-13 LAB — HIV ANTIBODY (ROUTINE TESTING W REFLEX): HIV Screen 4th Generation wRfx: NONREACTIVE

## 2020-11-13 LAB — TSH: TSH: 2.07 u[IU]/mL (ref 0.450–4.500)

## 2020-11-13 LAB — HEPATITIS C ANTIBODY: Hep C Virus Ab: <0.1 s/co ratio (ref 0.0–0.9)

## 2020-12-31 ENCOUNTER — Other Ambulatory Visit: Payer: Self-pay | Admitting: Family Medicine

## 2020-12-31 DIAGNOSIS — Z1231 Encounter for screening mammogram for malignant neoplasm of breast: Secondary | ICD-10-CM

## 2021-02-02 ENCOUNTER — Other Ambulatory Visit: Payer: Self-pay

## 2021-02-02 ENCOUNTER — Ambulatory Visit
Admission: RE | Admit: 2021-02-02 | Discharge: 2021-02-02 | Disposition: A | Discharge status: Home / Self Care | Payer: Managed Care, Other (non HMO) | Source: Ambulatory Visit | Attending: Family Medicine | Admitting: Family Medicine

## 2021-02-02 DIAGNOSIS — Z1231 Encounter for screening mammogram for malignant neoplasm of breast: Secondary | ICD-10-CM | POA: Diagnosis present

## 2021-06-10 ENCOUNTER — Telehealth: Payer: Self-pay | Admitting: Physician Assistant

## 2021-06-10 DIAGNOSIS — J45909 Unspecified asthma, uncomplicated: Secondary | ICD-10-CM

## 2021-06-10 MED ORDER — MONTELUKAST SODIUM 10 MG PO TABS: 10.0000 mg | ORAL_TABLET | Freq: Every day | ORAL | 2 refills | Status: DC

## 2021-06-10 NOTE — Telephone Encounter (Signed)
[facility] faxed refill request for the following medications:  montelukast (SINGULAIR) 10 MG tablet   Please advise.

## 2021-07-29 ENCOUNTER — Ambulatory Visit: Payer: Self-pay

## 2021-07-29 NOTE — Telephone Encounter (Signed)
Summary: UTI symptoms   Patient called in to say that she may be having a UTI say that she have them very often and they symptoms include cloudy urine, foul smell of urine and some burning during urination. She could not tell how long the symptoms have been happening say that she is prone to UTI so she often keep having symptoms. Please advise Ph# [phone]     Pt. Has appointment for 08/04/21 and is happy with this due to her work schedule. Instructed to call back for worsening of symptoms. Answer Assessment - Initial Assessment Questions 1. SYMPTOM: "What's the main symptom you're concerned about?" (e.g., frequency, incontinence)     Foul odor to urine 2. ONSET: "When did the  symptoms  start?"     Several weeks 3. PAIN: "Is there any pain?" If Yes, ask: "How bad is it?" (Scale: 1-10; mild, moderate, severe)     No 4. CAUSE: "What do you think is causing the symptoms?"     UTI 5. OTHER SYMPTOMS: "Do you have any other symptoms?" (e.g., fever, flank pain, blood in urine, pain with urination)     Burning 6. PREGNANCY: "Is there any chance you are pregnant?" "When was your last menstrual period?"     No  Protocols used: Urinary Symptoms-A-AH

## 2021-08-04 ENCOUNTER — Ambulatory Visit: Payer: Managed Care, Other (non HMO) | Admitting: Family Medicine

## 2021-08-04 ENCOUNTER — Other Ambulatory Visit: Payer: Self-pay

## 2021-08-04 ENCOUNTER — Encounter: Payer: Self-pay | Admitting: Family Medicine

## 2021-08-04 VITALS — BP 136/70 | HR 88 | Temp 97.9°F | Resp 16 | Ht 64.0 in | Wt 110.7 lb

## 2021-08-04 DIAGNOSIS — Z7184 Encounter for health counseling related to travel: Secondary | ICD-10-CM

## 2021-08-04 DIAGNOSIS — R300 Dysuria: Secondary | ICD-10-CM | POA: Diagnosis not present

## 2021-08-04 DIAGNOSIS — Z23 Encounter for immunization: Secondary | ICD-10-CM

## 2021-08-04 LAB — POCT URINALYSIS DIPSTICK
Bilirubin, UA: NEGATIVE
Glucose, UA: NEGATIVE
Ketones, UA: NEGATIVE
Nitrite, UA: NEGATIVE
Protein, UA: NEGATIVE
Spec Grav, UA: 1.01 (ref 1.010–1.025)
Urobilinogen, UA: 0.2 E.U./dL
pH, UA: 6 (ref 5.0–8.0)

## 2021-08-04 MED ORDER — LORAZEPAM 0.5 MG PO TABS: 0.5000 mg | ORAL_TABLET | Freq: Every day | ORAL | 0 refills | Status: DC | PRN

## 2021-08-04 MED ORDER — DOXYCYCLINE HYCLATE 100 MG PO TABS: 100.0000 mg | ORAL_TABLET | Freq: Every day | ORAL | 0 refills | Status: DC

## 2021-08-04 MED ORDER — SULFAMETHOXAZOLE-TRIMETHOPRIM 800-160 MG PO TABS: 1.0000 | ORAL_TABLET | Freq: Two times a day (BID) | ORAL | 0 refills | Status: AC

## 2021-08-04 NOTE — Progress Notes (Signed)
° °  SUBJECTIVE:   CHIEF COMPLAINT / HPI:   URINARY SYMPTOMS  Dysuria: burning Urinary frequency: yes Urgency:  occasionally Small volume voids: yes Urinary incontinence: no Foul odor: yes Hematuria: no Abdominal pain: no Back pain: no Suprapubic pain/pressure: no Flank pain: no Fever:  no Vomiting: no Relief with cranberry juice: no Relief with pyridium: no Status: better/worse/stable Previous urinary tract infection: yes Recurrent urinary tract infection: no Sexual activity: No sexually active/monogomous/practicing safe sex Vaginal discharge: no Treatments attempted: cranberry   Travel - going to [place], [place]. Leaving March 6th. Going on mission trip to [place] 7 days with church group. Going on hot air balloon while there, wants something for anxiety, does ok with airplane flying. Received all childhood vaccinations. Has received 2 doses of mRNA COVID vaccine. Due for flu shot. 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    OBJECTIVE:   BP 136/70 (BP Location: Right Arm, Patient Position: Sitting, Cuff Size: Normal)    Pulse 88    Temp 97.9 F (36.6 C) (Temporal)    Resp 16    Ht 5\' 4"  (1.626 m)    Wt 110 lb 11.2 oz (50.2 kg)    SpO2 99%    BMI 19.00 kg/m   Gen: well appearing, in NAD Card: Reg rate Lungs: Comfortable WOB on RA Ext: WWP, no edema   ASSESSMENT/PLAN:   Dysuria UA with mod leuks, trace blood. Will treat with bactrim given prior culture results and send current specimen for culture.   Travel advice encounter Reviewed CDC [place] guidelines for travelling to [place]. Does not require yellow fever ppx. Does require malaria ppx, sent doxycycline to pharmacy, to start 1-2 days prior to departure. Flu shot received today. 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 Recommend checking with health department. Recommend  receiving COVID booster at pharmacy. Counseled on usual prevention for waterborne and zoonotic illnesses.     Myles Gip, DO

## 2021-08-04 NOTE — Assessment & Plan Note (Addendum)
Reviewed CDC [place] guidelines for travelling to [place]. Does not require yellow fever ppx. Does require malaria ppx, sent doxycycline to pharmacy, to start 1-2 days prior to departure. Flu shot received today. 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 Recommend checking with health department. Recommend receiving COVID booster at pharmacy. Counseled on usual prevention for waterborne and zoonotic illnesses.

## 2021-08-04 NOTE — Assessment & Plan Note (Signed)
UA with mod leuks, trace blood. Will treat with bactrim given prior culture results and send current specimen for culture.

## 2021-08-04 NOTE — Patient Instructions (Signed)
It was great to see you!  Our plans for today:  - Take the bactrim antibiotic for your urine infection. We will let you know if we need to change this antibiotic based on your culture results. - Take the doxycycline antibiotic to prevent malaria. Start this the day before you leave for your trip. Continue daily until gone.  - Check with the pharmacy about getting your updated COVID booster.  - Make sure to avoid contaminated water and raw/undercooked foods while traveling as well as swimming in any standing/stagnant water.   Take care and seek immediate care sooner if you develop any concerns.   Dr. Ky Barban

## 2021-08-08 LAB — CULTURE, URINE COMPREHENSIVE

## 2021-08-11 MED ORDER — NITROFURANTOIN MONOHYD MACRO 100 MG PO CAPS: 100.0000 mg | ORAL_CAPSULE | Freq: Two times a day (BID) | ORAL | 0 refills | Status: AC

## 2021-08-11 NOTE — Addendum Note (Signed)
Addended by: Myles Gip on: 08/11/2021 08:40 AM   Modules accepted: Orders

## 2021-08-24 ENCOUNTER — Ambulatory Visit: Payer: Self-pay | Admitting: *Deleted

## 2021-08-24 NOTE — Telephone Encounter (Signed)
Summary: cloudy urine and odor   Patient called in, still has cloudy urine and odor after taking medicine. Marlana Salvage is asking what else she can do. Pleaae call back.        Chief Complaint: urine cloudy after 2 days of completing 2nd round of antibiotics  Symptoms: cloudy urine noted again with vaginal itching reported.  Frequency: na Pertinent Negatives: Patient denies pain urinating, frequency, incontinence, burning urination Disposition: [] ED /[] Urgent Care (no appt availability in office) / [] Appointment(In office/virtual)/ []  [facility]/ [] Home Care/ [] Refused Recommended Disposition /[] [facility]/ [x]  Follow-up with PCP Additional Notes:   Please advise if additional appt needed. Requesting antibiotic cipro. Patient reports cipro has worked to get rid of UTI in the past and has had 2 rounds of antibiotics and symptoms remain.       Reason for Disposition  All other urine symptoms  Answer Assessment - Initial Assessment Questions 1. SYMPTOM: "What's the main symptom you're concerned about?" (e.g., frequency, incontinence)     No  2. ONSET: "When did the  na  start?"     na 3. PAIN: "Is there any pain?" If Yes, ask: "How bad is it?" (Scale: 1-10; mild, moderate, severe)     No  4. CAUSE: "What do you think is causing the symptoms?"     UTI  5. OTHER SYMPTOMS: "Do you have any other symptoms?" (e.g., fever, flank pain, blood in urine, pain with urination)     Vaginal itching  6. PREGNANCY: "Is there any chance you are pregnant?" "When was your last menstrual period?"     na  Protocols used: Urinary Symptoms-A-AH

## 2021-08-25 MED ORDER — CIPROFLOXACIN HCL 250 MG PO TABS: 250.0000 mg | ORAL_TABLET | Freq: Two times a day (BID) | ORAL | 0 refills | Status: AC

## 2021-08-25 NOTE — Addendum Note (Signed)
Addended by: Myles Gip on: 08/25/2021 09:43 AM   Modules accepted: Orders

## 2021-08-25 NOTE — Telephone Encounter (Signed)
Advised 

## 2021-08-31 ENCOUNTER — Other Ambulatory Visit: Payer: Self-pay | Admitting: Family Medicine

## 2021-08-31 DIAGNOSIS — Z7184 Encounter for health counseling related to travel: Secondary | ICD-10-CM

## 2021-09-01 NOTE — Telephone Encounter (Signed)
Call to patient- patient advised medication refill is not needed at this time- ok to refuse request. Requested Prescriptions  Pending Prescriptions Disp Refills   doxycycline (VIBRA-TABS) 100 MG tablet [Pharmacy Med Name: DOXYCYCLINE HYCLATE 100 MG TAB] 30 tablet 1    Sig: TAKE 1 TABLET (100 MG TOTAL) BY MOUTH DAILY. START THE DAY PRIOR TO TRAVEL.     Off-Protocol Failed - 08/31/2021  8:10 AM      Failed - Medication not assigned to a protocol, review manually.      Passed - Valid encounter within last 12 months    Recent Outpatient Visits          4 weeks ago [place] Myles Gip, DO   10 months ago Annual physical exam   [facility] Just, Laurita Quint, FNP   1 year ago [place] Trinna Post, [place]   2 years ago Pyelonephritis   [place], [place], [place]   2 years ago Impingement syndrome of right shoulder   Stevenson, [place], [place]

## 2022-01-05 ENCOUNTER — Other Ambulatory Visit: Payer: Self-pay | Admitting: Family Medicine

## 2022-01-05 DIAGNOSIS — Z1231 Encounter for screening mammogram for malignant neoplasm of breast: Secondary | ICD-10-CM

## 2022-01-31 IMAGING — MG MM DIGITAL SCREENING BILAT W/ TOMO AND CAD
8 series · 9 of 24 positions shown · non-contrast
Comparison: Previous exam(s).

CLINICAL DATA: Screening.

EXAM:
DIGITAL SCREENING BILATERAL MAMMOGRAM WITH TOMOSYNTHESIS AND CAD
TECHNIQUE: Bilateral screening digital craniocaudal and mediolateral oblique
mammograms were obtained. Bilateral screening digital breast
tomosynthesis was performed. The images were evaluated with
computer-aided detection.

[L MLO synth-2D]
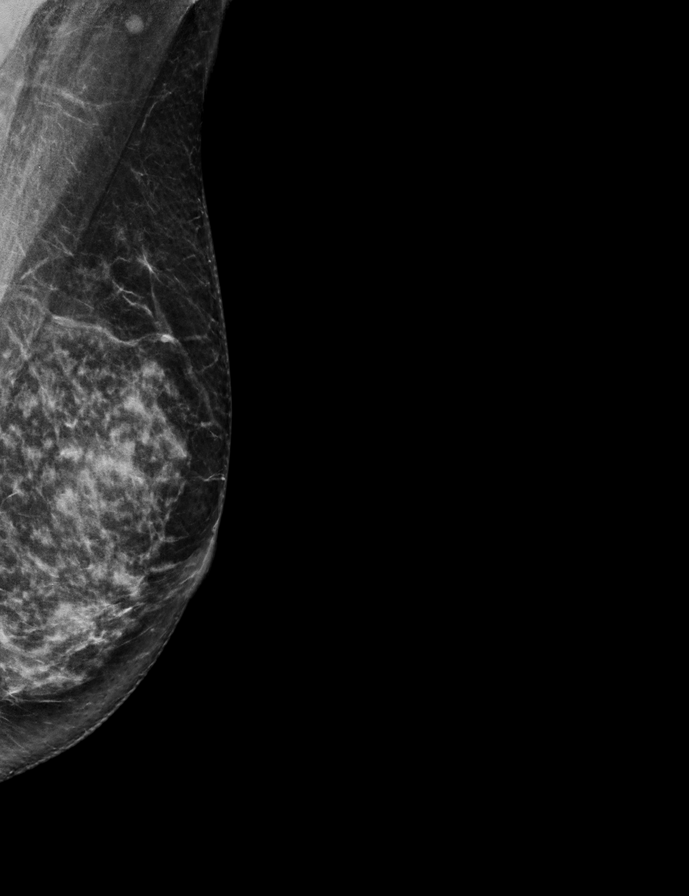

[L CC synth-2D]
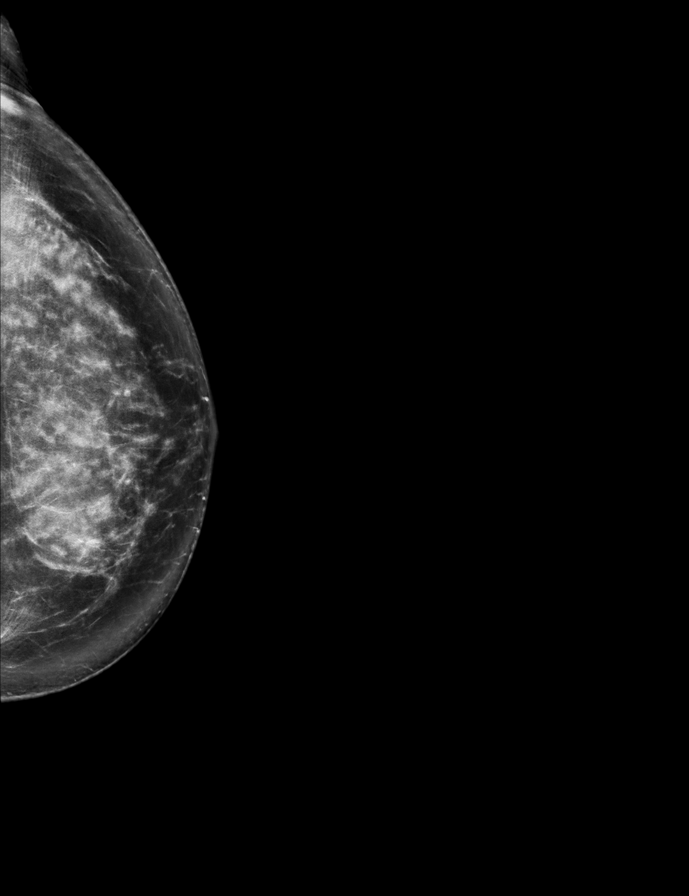

[R CC synth-2D]
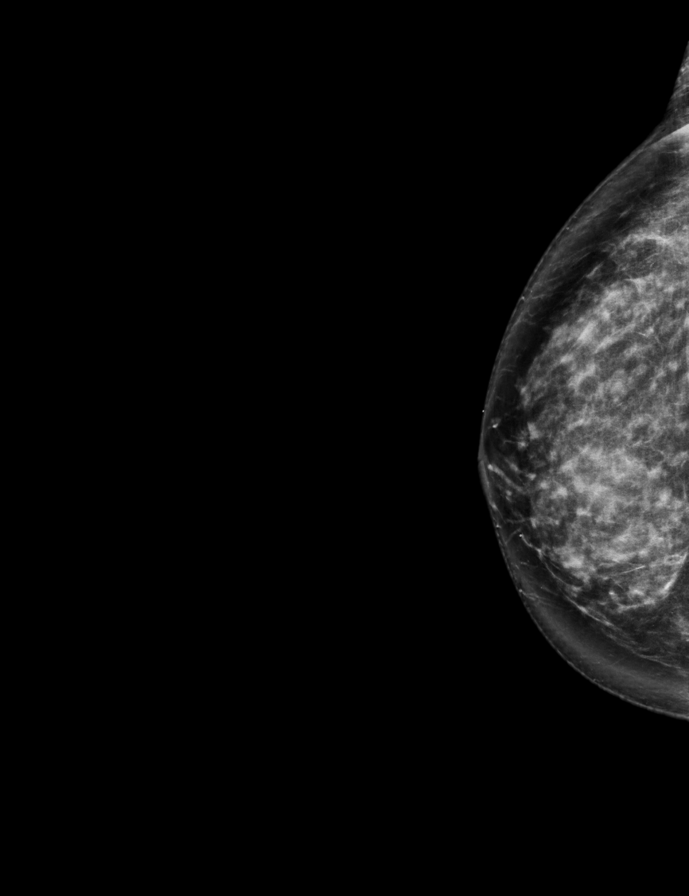

[R MLO synth-2D]
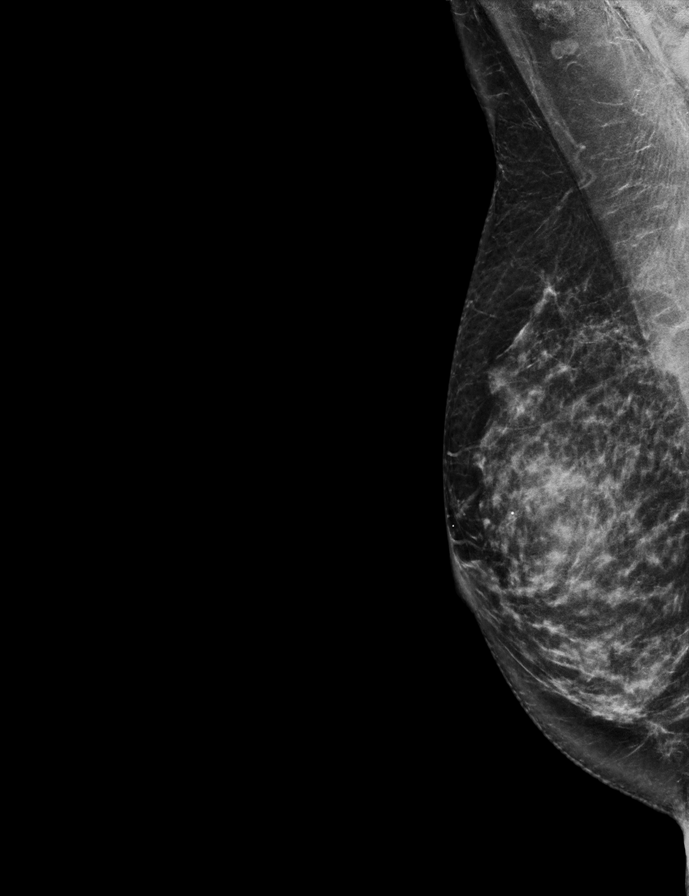

[L MLO tomo · 2 of 62 frames shown]
[frame 21/62]
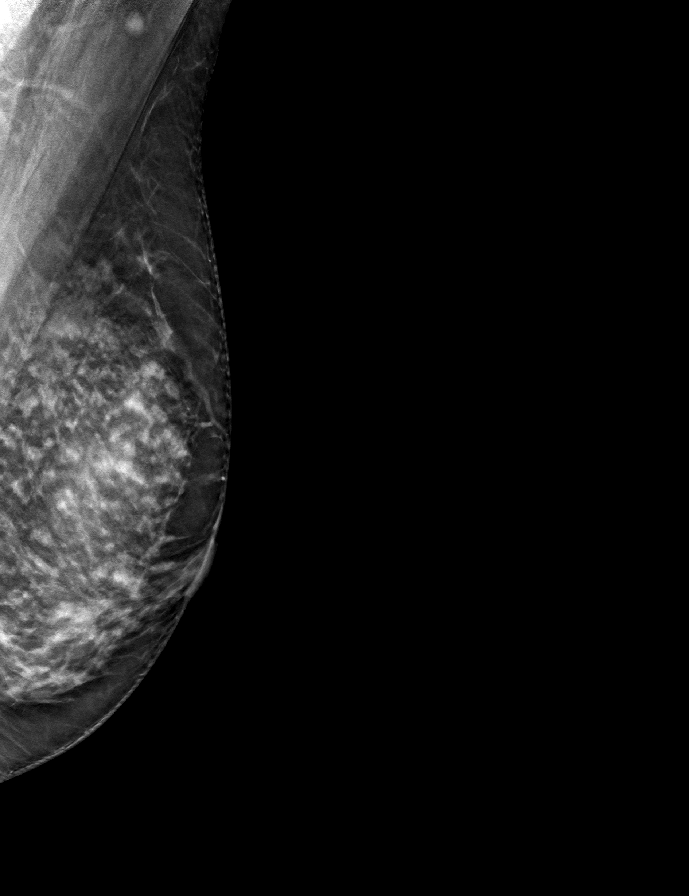
[frame 31/62]
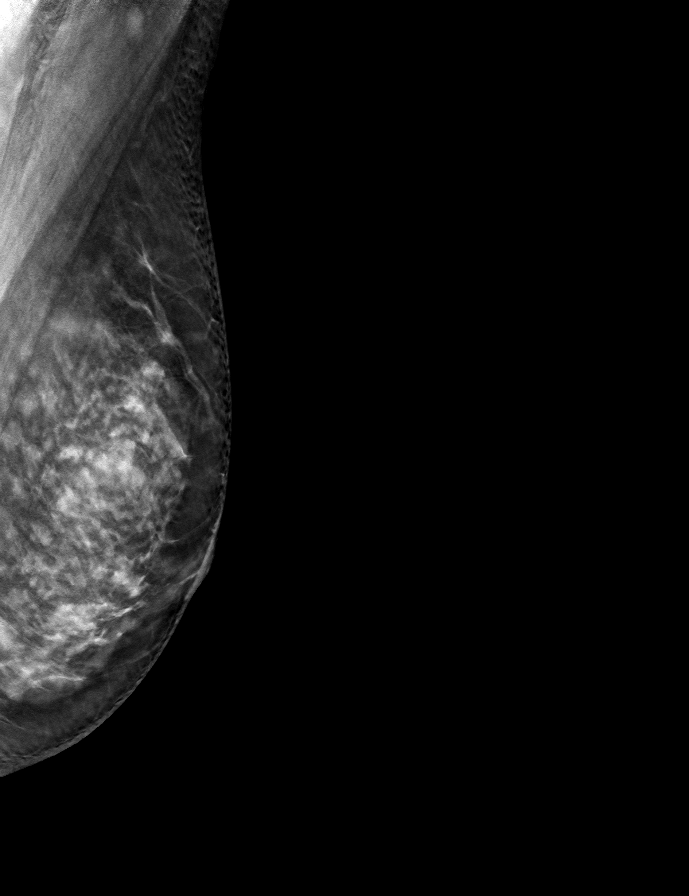

[R MLO tomo · tomo slice 32/63.0]
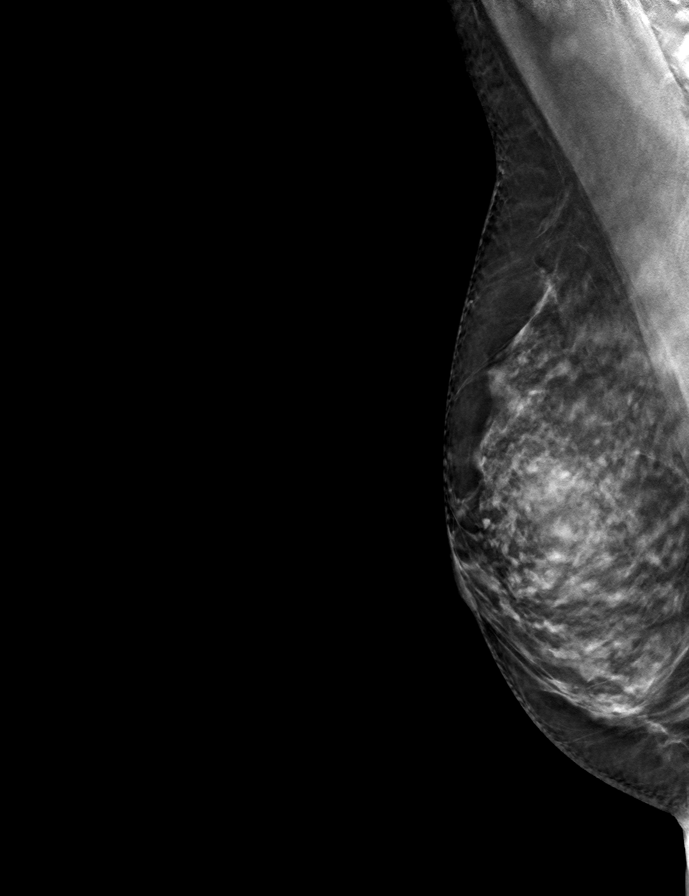

[L CC tomo · tomo slice 38/75.0]
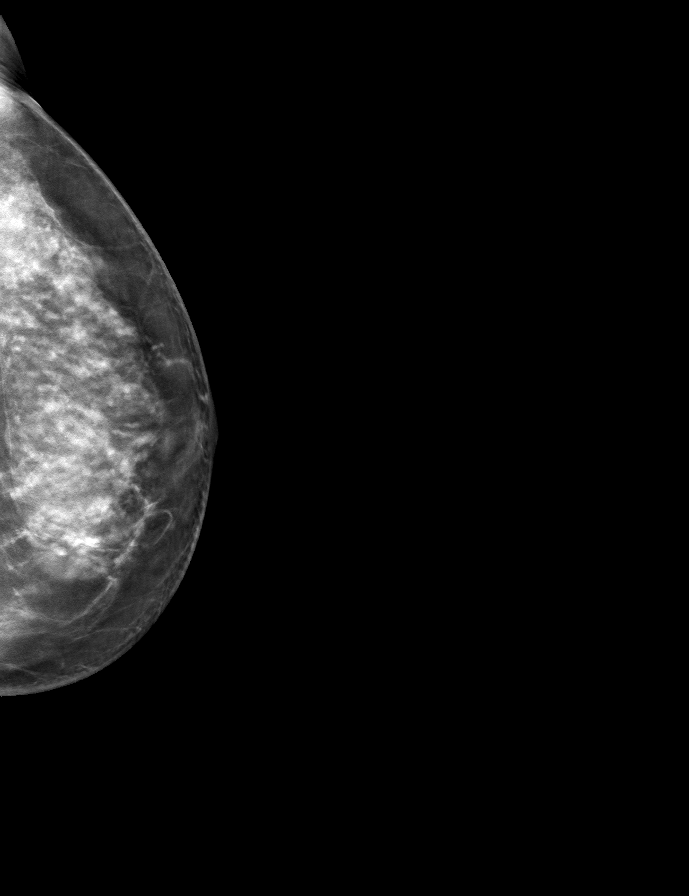

[R CC tomo · tomo slice 38/75.0]
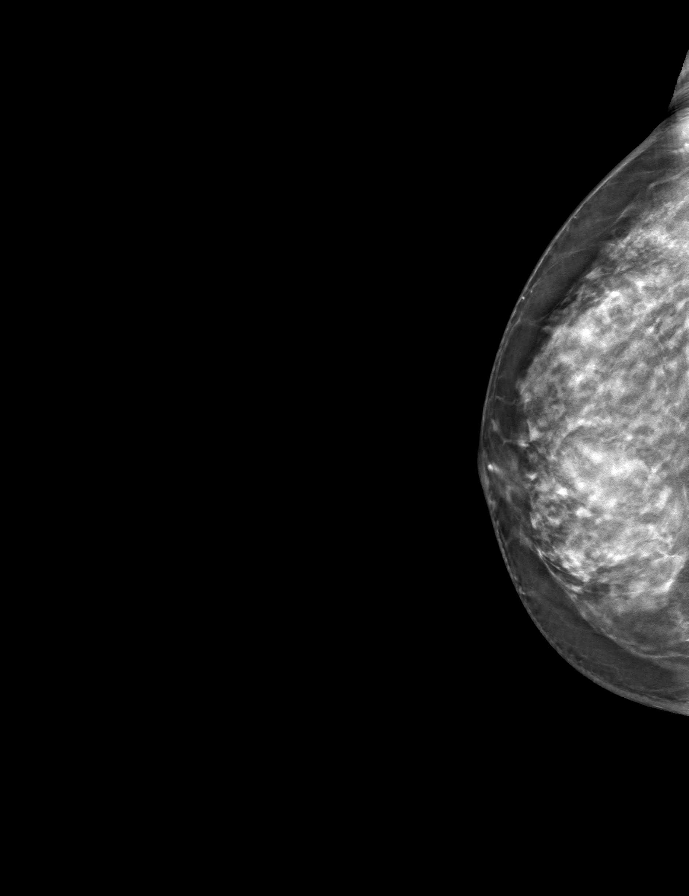

[9 of 24 positions shown; findings below may reference images not displayed]

ACR Breast Density Category c: The breast tissue is heterogeneously
dense, which may obscure small masses.
FINDINGS: There are no findings suspicious for malignancy.
IMPRESSION: No mammographic evidence of malignancy. A result letter of this
screening mammogram will be mailed directly to the patient.

RECOMMENDATION:
Screening mammogram in one year. (Code:Q3-W-BC3)

BI-RADS CATEGORY  1: Negative.

## 2022-02-03 ENCOUNTER — Ambulatory Visit
Admission: RE | Admit: 2022-02-03 | Discharge: 2022-02-03 | Disposition: A | Discharge status: Home / Self Care | Payer: Managed Care, Other (non HMO) | Source: Ambulatory Visit | Attending: Family Medicine | Admitting: Family Medicine

## 2022-02-03 DIAGNOSIS — Z1231 Encounter for screening mammogram for malignant neoplasm of breast: Secondary | ICD-10-CM | POA: Insufficient documentation

## 2022-04-05 ENCOUNTER — Other Ambulatory Visit: Payer: Self-pay | Admitting: Physician Assistant

## 2022-04-05 DIAGNOSIS — J45909 Unspecified asthma, uncomplicated: Secondary | ICD-10-CM

## 2022-10-26 NOTE — Progress Notes (Unsigned)
I,J'ya E Keshaun Dubey,acting as a scribe for Jacky Kindle, FNP.,have documented all relevant documentation on the behalf of Jacky Kindle, FNP,as directed by  Jacky Kindle, FNP while in the presence of Jacky Kindle, FNP.   Complete physical exam   Patient: Brandy Hale   DOB: 1966/01/27   57 y.o. Female  MRN: [id number] Visit Date: 10/27/2022  Today's healthcare provider: Jacky Kindle, FNP  Introduced to nurse practitioner role and practice setting.  All questions answered.  Discussed provider/patient relationship and expectations.  Chief Complaint  Patient presents with   Annual Exam   Subjective    Brandy Hale is a 57 y.o. female who presents today for a complete physical exam.  She reports consuming a general diet. The patient does not participate in regular exercise at present. She generally feels well. She reports sleeping poorly. She does have additional problems to discuss today.   Patient complains of left shoulder pain, she says that pain started two months ago when she tried to clasp her bra. Patient reports that she was diagnosed with bursitis "long long time ago" in her right shoulder that does not bother her as much. Patient reports that the pain in her left shoulder is worst, 5/10 being her pain score.   HPI   Past Medical History:  Diagnosis Date   Allergy    Asthma    Cancer    melanoma   Insomnia    Past Surgical History:  Procedure Laterality Date   ABDOMINAL SURGERY     APPENDECTOMY     CYST EXCISION Left 11/2010   under armpit   EYE SURGERY     MELANOMA EXCISION  2008   OVARIAN CYST SURGERY  1985   SKIN BIOPSY     Social History   Socioeconomic History   Marital status: Divorced    Spouse name: Not on file   Number of children: 0   Years of education: some college   Highest education level: Not on file  Occupational History    Employer: [employer]  Tobacco Use   Smoking status: Never   Smokeless tobacco: Never  Vaping Use    Vaping Use: Never used  Substance and Sexual Activity   Alcohol use: Yes    Comment: occasional, drinks wine   Drug use: No   Sexual activity: Yes  Other Topics Concern   Not on file  Social History Narrative   Not on file   Social Determinants of Health   Financial Resource Strain: Not on file  Food Insecurity: Not on file  Transportation Needs: Not on file  Physical Activity: Not on file  Stress: Not on file  Social Connections: Not on file  Intimate Partner Violence: Not on file   Family Status  Relation Name Status   Mother  Deceased       breast cancer stage 4   Father  Deceased at age 40       heart trouble   Sister  Alive   Brother  Deceased       MVA   Sister  Alive       otosclerosis   Sister  Alive       otosclerosis   Sister  Alive   Brother  Deceased       MVA   Brother  Deceased       accidental   Family History  Problem Relation Age of Onset   Hypertension Mother  Hyperlipidemia Mother    Breast cancer Mother 44   Heart Problems Father    Hyperlipidemia Sister    Other Sister    Other Sister    Melanoma Sister    No Known Allergies  Patient Care Team: Jacky Kindle, FNP as PCP - General (Family Medicine)   Medications: Outpatient Medications Prior to Visit  Medication Sig   Biotin 10 MG TABS Take by mouth.   Cranberry 1000 MG CAPS Take by mouth.   montelukast (SINGULAIR) 10 MG tablet TAKE 1 TABLET BY MOUTH DAILY   MULTIPLE VITAMIN PO Take 1 tablet by mouth daily.    [DISCONTINUED] loratadine (CLARITIN) 10 MG tablet Take 10 mg by mouth daily as needed for allergies.   [DISCONTINUED] LORazepam (ATIVAN) 0.5 MG tablet Take 1 tablet (0.5 mg total) by mouth daily as needed for anxiety.   [DISCONTINUED] Melatonin 10 MG TABS Take 1 tablet by mouth at bedtime as needed.   [DISCONTINUED] doxycycline (VIBRA-TABS) 100 MG tablet Take 1 tablet (100 mg total) by mouth daily. Start the day prior to travel. (Patient not taking: Reported on 10/27/2022)    [DISCONTINUED] ferrous sulfate 324 MG TBEC Take 324 mg by mouth. (Patient not taking: Reported on 10/27/2022)   No facility-administered medications prior to visit.    Review of Systems  Musculoskeletal:  Positive for arthralgias.    Objective    BP 94/72 (BP Location: Right Arm, Patient Position: Sitting, Cuff Size: Normal)   Pulse 88   Temp 98.6 F (37 C) (Oral)   Resp 16   Ht 5\' 4"  (1.626 m)   Wt 110 lb (49.9 kg)   SpO2 100%   BMI 18.88 kg/m    Physical Exam    Last depression screening scores    10/27/2022    1:48 PM 08/04/2021   10:26 AM 11/04/2020    2:59 PM  PHQ 2/9 Scores  PHQ - 2 Score 0 0 0  PHQ- 9 Score 1 1 2    Last fall risk screening    10/27/2022    1:47 PM  Fall Risk   Falls in the past year? 0  Number falls in past yr: 0  Injury with Fall? 0  Risk for fall due to : No Fall Risks   Last Audit-C alcohol use screening    10/27/2022    1:48 PM  Alcohol Use Disorder Test (AUDIT)  1. How often do you have a drink containing alcohol? 1  2. How many drinks containing alcohol do you have on a typical day when you are drinking? 0  3. How often do you have six or more drinks on one occasion? 0  AUDIT-C Score 1   A score of 3 or more in women, and 4 or more in men indicates increased risk for alcohol abuse, EXCEPT if all of the points are from question 1   No results found for any visits on 10/27/22.  Assessment & Plan    Routine Health Maintenance and Physical Exam  Exercise Activities and Dietary recommendations  Goals   None     Immunization History  Administered Date(s) Administered   Influenza,inj,Quad PF,6+ Mos 06/15/2019, 08/04/2021   Influenza-Unspecified 06/10/2017   Tdap 12/15/2018    Health Maintenance  Topic Date Due   COVID-19 Vaccine (1) Never done   Zoster Vaccines- Shingrix (1 of 2) Never done   INFLUENZA VACCINE  02/10/2023   MAMMOGRAM  02/04/2024   COLONOSCOPY (Pts 45-38yrs Insurance coverage will need to be  confirmed)  10/18/2024   PAP SMEAR-Modifier  11/04/2025   DTaP/Tdap/Td (2 - Td or Tdap) 12/14/2028   Hepatitis C Screening  Completed   HIV Screening  Completed   HPV VACCINES  Aged Out    Discussed health benefits of physical activity, and encouraged her to engage in regular exercise appropriate for her age and condition.  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  - Eat a low-fat diet with lots of fruits and vegetables, up to 7-9 servings per day.  - Seatbelts can save your life. Wear them always.  - Smoke detectors on every level of your home, check batteries every year.  - Eye Doctor - have an eye exam every 1-2 years  - Safe sex - if you may be exposed to STDs, use a condom.  - Alcohol -  If you drink, do it moderately, less than 2 drinks per day.  - Health Care Power of Attorney. Choose someone to speak for you if you are not able.  - Depression is common in our stressful world.If you're feeling down or losing interest in things you normally enjoy, please come in for a visit.  - Violence - If anyone is threatening or hurting you, please call immediately.       Relevant Orders   CBC with Differential/Platelet   Comprehensive Metabolic Panel (CMET)   TSH + free T4   Lipid panel   Elevated LDL cholesterol level    Repeat LP LDL goal of <100 recommend diet low in saturated fat and regular exercise - 30 min at least 5 times per  week       Relevant Orders   Lipid panel   Encounter for screening mammogram for malignant neoplasm of breast    Due for screening for mammogram, denies breast concerns, provided with phone number to call and schedule appointment for mammogram. Encouraged to repeat breast cancer screening every 1-2 years.       Relevant Orders   MM 3D SCREENING MAMMOGRAM BILATERAL BREAST   History of melanoma    R arm; followed by derm now every year Notes no new skin concerns/changes       Insomnia    Chronic, variable Trial of low dose ambien to assist Reports previous failure of TCA, melatonin and trazodone RTC in 6 months       Prediabetes    Chronic, previously stable with diet/exercise Repeat A1c Continue to recommend balanced, lower carb meals. Smaller meal size, adding snacks. Choosing water as drink of choice and increasing purposeful exercise.       Relevant Orders   Hemoglobin A1c   Return in about 6 months (around 04/28/2023) for chonic disease management- ambien use.    Leilani Merl, FNP, have reviewed all documentation for this visit. The documentation on 10/27/22 for the exam, diagnosis, procedures,  and orders are all accurate and complete.  Jacky Kindle, FNP  [facility] [phone] (phone) [phone] (fax)  [facility]

## 2022-10-27 ENCOUNTER — Encounter: Payer: Self-pay | Admitting: Family Medicine

## 2022-10-27 ENCOUNTER — Ambulatory Visit (INDEPENDENT_AMBULATORY_CARE_PROVIDER_SITE_OTHER): Payer: Managed Care, Other (non HMO) | Admitting: Family Medicine

## 2022-10-27 VITALS — BP 94/72 | HR 88 | Temp 98.6°F | Resp 16 | Ht 64.0 in | Wt 110.0 lb

## 2022-10-27 DIAGNOSIS — Z0000 Encounter for general adult medical examination without abnormal findings: Secondary | ICD-10-CM | POA: Diagnosis not present

## 2022-10-27 DIAGNOSIS — E7800 Pure hypercholesterolemia, unspecified: Secondary | ICD-10-CM

## 2022-10-27 DIAGNOSIS — Z1231 Encounter for screening mammogram for malignant neoplasm of breast: Secondary | ICD-10-CM

## 2022-10-27 DIAGNOSIS — R7303 Prediabetes: Secondary | ICD-10-CM

## 2022-10-27 DIAGNOSIS — Z85820 Personal history of malignant melanoma of skin: Secondary | ICD-10-CM

## 2022-10-27 DIAGNOSIS — M25512 Pain in left shoulder: Secondary | ICD-10-CM | POA: Diagnosis not present

## 2022-10-27 DIAGNOSIS — F5101 Primary insomnia: Secondary | ICD-10-CM | POA: Diagnosis not present

## 2022-10-27 MED ORDER — ZOLPIDEM TARTRATE 5 MG PO TABS: 5.0000 mg | ORAL_TABLET | Freq: Every evening | ORAL | 5 refills | Status: DC | PRN

## 2022-10-27 MED ORDER — MELOXICAM 15 MG PO TABS: 15.0000 mg | ORAL_TABLET | Freq: Every day | ORAL | 2 refills | Status: DC

## 2022-10-27 MED ORDER — TIZANIDINE HCL 4 MG PO TABS: 4.0000 mg | ORAL_TABLET | Freq: Four times a day (QID) | ORAL | 1 refills | Status: DC | PRN

## 2022-10-27 NOTE — Patient Instructions (Signed)
Please call and schedule your mammogram:  [facility]  [address], [address] [facility] [place],  [place]  [address] Get Driving Directions Main: [phone]  Sunday:Closed Monday:7:20 AM - 5:00 PM Tuesday:7:20 AM - 5:00 PM Wednesday:7:20 AM - 5:00 PM Thursday:7:20 AM - 5:00 PM Friday:7:20 AM - 4:30 PM Saturday:Closed  

## 2022-10-27 NOTE — Assessment & Plan Note (Signed)
UTD on dental, vision, derm UTD on PAP, mammo [due 7/24] and colon Works from home Long standing partner; lab mix Low risk for drugs, etoh, tobacco Things to do to keep yourself healthy  - Exercise at least 30-45 minutes a day, 3-4 days a week.  - Eat a low-fat diet with lots of fruits and vegetables, up to 7-9 servings per day.  - Seatbelts can save your life. Wear them always.  - Smoke detectors on every level of your home, check batteries every year.  - Eye Doctor - have an eye exam every 1-2 years  - Safe sex - if you may be exposed to STDs, use a condom.  - Alcohol -  If you drink, do it moderately, less than 2 drinks per day.  - Health Care Power of Attorney. Choose someone to speak for you if you are not able.  - Depression is common in our stressful world.If you're feeling down or losing interest in things you normally enjoy, please come in for a visit.  - Violence - If anyone is threatening or hurting you, please call immediately.

## 2022-10-27 NOTE — Assessment & Plan Note (Signed)
Chronic, previously stable with diet/exercise Repeat A1c Continue to recommend balanced, lower carb meals. Smaller meal size, adding snacks. Choosing water as drink of choice and increasing purposeful exercise.  

## 2022-10-27 NOTE — Assessment & Plan Note (Signed)
Due for screening for mammogram, denies breast concerns, provided with phone number to call and schedule appointment for mammogram. Encouraged to repeat breast cancer screening every 1-2 years.  

## 2022-10-27 NOTE — Assessment & Plan Note (Signed)
Chronic, variable Trial of low dose ambien to assist Reports previous failure of TCA, melatonin and trazodone RTC in 6 months

## 2022-10-27 NOTE — Assessment & Plan Note (Signed)
R arm; followed by derm now every year Notes no new skin concerns/changes

## 2022-10-27 NOTE — Assessment & Plan Note (Signed)
Repeat LP LDL goal of <100 recommend diet low in saturated fat and regular exercise - 30 min at least 5 times per week

## 2022-10-27 NOTE — Assessment & Plan Note (Signed)
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

## 2022-10-28 LAB — CBC WITH DIFFERENTIAL/PLATELET
Basophils Absolute: 0 10*3/uL (ref 0.0–0.2)
Basos: 0 %
EOS (ABSOLUTE): 0.1 10*3/uL (ref 0.0–0.4)
Eos: 1 %
Hematocrit: 39.5 % (ref 34.0–46.6)
Hemoglobin: 13.4 g/dL (ref 11.1–15.9)
Immature Grans (Abs): 0 10*3/uL (ref 0.0–0.1)
Immature Granulocytes: 0 %
Lymphocytes Absolute: 1.8 10*3/uL (ref 0.7–3.1)
Lymphs: 26 %
MCH: 31.2 pg (ref 26.6–33.0)
MCHC: 33.9 g/dL (ref 31.5–35.7)
MCV: 92 fL (ref 79–97)
Monocytes Absolute: 0.5 10*3/uL (ref 0.1–0.9)
Monocytes: 6 %
Neutrophils Absolute: 4.8 10*3/uL (ref 1.4–7.0)
Neutrophils: 67 %
Platelets: 357 10*3/uL (ref 150–450)
RBC: 4.3 x10E6/uL (ref 3.77–5.28)
RDW: 11.8 % (ref 11.7–15.4)
WBC: 7.2 10*3/uL (ref 3.4–10.8)

## 2022-10-28 LAB — COMPREHENSIVE METABOLIC PANEL
ALT: 22 IU/L (ref 0–32)
AST: 28 IU/L (ref 0–40)
Albumin/Globulin Ratio: 2 (ref 1.2–2.2)
Albumin: 4.8 g/dL (ref 3.8–4.9)
Alkaline Phosphatase: 78 IU/L (ref 44–121)
BUN/Creatinine Ratio: 17 (ref 9–23)
BUN: 14 mg/dL (ref 6–24)
Bilirubin Total: 0.2 mg/dL (ref 0.0–1.2)
CO2: 25 mmol/L (ref 20–29)
Calcium: 9.7 mg/dL (ref 8.7–10.2)
Chloride: 99 mmol/L (ref 96–106)
Creatinine, Ser: 0.82 mg/dL (ref 0.57–1.00)
Globulin, Total: 2.4 g/dL (ref 1.5–4.5)
Glucose: 94 mg/dL (ref 70–99)
Potassium: 4.1 mmol/L (ref 3.5–5.2)
Sodium: 140 mmol/L (ref 134–144)
Total Protein: 7.2 g/dL (ref 6.0–8.5)
eGFR: 83 mL/min/{1.73_m2} (ref >59)

## 2022-10-28 LAB — TSH+FREE T4
Free T4: 1.28 ng/dL (ref 0.82–1.77)
TSH: 1.57 u[IU]/mL (ref 0.450–4.500)

## 2022-10-28 LAB — LIPID PANEL
Chol/HDL Ratio: 3.3 ratio (ref 0.0–4.4)
Cholesterol, Total: 247 mg/dL — ABNORMAL HIGH (ref 100–199)
HDL: 75 mg/dL (ref >39)
LDL Chol Calc (NIH): 147 mg/dL — ABNORMAL HIGH (ref 0–99)
Triglycerides: 142 mg/dL (ref 0–149)
VLDL Cholesterol Cal: 25 mg/dL (ref 5–40)

## 2022-10-28 LAB — HEMOGLOBIN A1C
Est. average glucose Bld gHb Est-mCnc: 114 mg/dL
Hgb A1c MFr Bld: 5.6 % (ref 4.8–5.6)

## 2022-10-28 NOTE — Progress Notes (Signed)
Slight increase in cholesterol; I continue to recommend diet low in saturated fat and regular exercise - 30 min at least 5 times per week as stroke risk is low at 2% in 10 years. Pre-diabetes is stable at 5.6%.   For reference:  The 10-year ASCVD risk score (Arnett DK, et al., 2019) is: 1.4%   Values used to calculate the score:     Age: 57 years     Sex: Female     Is Non-Hispanic African American: No     Diabetic: No     Tobacco smoker: No     Systolic Blood Pressure: 94 mmHg     Is BP treated: No     HDL Cholesterol: 75 mg/dL     Total Cholesterol: 247 mg/dL

## 2022-12-08 ENCOUNTER — Other Ambulatory Visit: Payer: Self-pay | Admitting: Family Medicine

## 2022-12-08 NOTE — Telephone Encounter (Signed)
Please advise 

## 2023-01-22 ENCOUNTER — Other Ambulatory Visit: Payer: Self-pay | Admitting: Family Medicine

## 2023-01-24 NOTE — Telephone Encounter (Signed)
Requested medication (s) are due for refill today: yes  Requested medication (s) are on the active medication list: yes  Last refill:  12/08/22  Future visit scheduled: no  Notes to clinic:  Unable to refill per protocol, cannot delegate.      Requested Prescriptions  Pending Prescriptions Disp Refills   tiZANidine (ZANAFLEX) 4 MG tablet [Pharmacy Med Name: TIZANIDINE HCL 4 MG TABLET] 90 tablet 1    Sig: TAKE 1 TABLET BY MOUTH EVERY 6 HOURS AS NEEDED FOR MUSCLE SPASMS.     Not Delegated - Cardiovascular:  Alpha-2 Agonists - tizanidine Failed - 01/22/2023  9:18 AM      Failed - This refill cannot be delegated      Passed - Valid encounter within last 6 months    Recent Outpatient Visits           2 months ago Annual physical exam   [facility] Jacky Kindle, FNP   1 year ago Dysuria   [facility] Caro Laroche, DO   2 years ago Annual physical exam   [facility] Just, Azalee Course, FNP   2 years ago Dysuria   [facility] Trey Sailors, [place]   3 years ago Pyelonephritis   [facility], Ricki Rodriguez [place], [place]

## 2023-01-27 ENCOUNTER — Other Ambulatory Visit: Payer: Self-pay | Admitting: Family Medicine

## 2023-02-07 ENCOUNTER — Ambulatory Visit
Admission: RE | Admit: 2023-02-07 | Discharge: 2023-02-07 | Disposition: A | Discharge status: Home / Self Care | Payer: Managed Care, Other (non HMO) | Source: Ambulatory Visit | Attending: Family Medicine | Admitting: Family Medicine

## 2023-02-07 DIAGNOSIS — Z1231 Encounter for screening mammogram for malignant neoplasm of breast: Secondary | ICD-10-CM | POA: Diagnosis present

## 2023-02-08 NOTE — Progress Notes (Signed)
Hi Enda  Normal mammogram; repeat in 1 year.  Please let us know if you have any questions.  Thank you,  Elise Payne, FNP 

## 2023-02-14 ENCOUNTER — Other Ambulatory Visit: Payer: Self-pay | Admitting: Family Medicine

## 2023-02-14 DIAGNOSIS — J45909 Unspecified asthma, uncomplicated: Secondary | ICD-10-CM

## 2023-03-26 ENCOUNTER — Other Ambulatory Visit: Payer: Self-pay | Admitting: Family Medicine

## 2023-06-02 ENCOUNTER — Other Ambulatory Visit: Payer: Self-pay | Admitting: Family Medicine

## 2023-06-03 NOTE — Telephone Encounter (Signed)
Please advise 

## 2023-10-03 ENCOUNTER — Telehealth: Payer: Self-pay | Admitting: Family Medicine

## 2023-10-03 NOTE — Telephone Encounter (Signed)
 Faxed off request for medical records today for patient

## 2023-12-23 ENCOUNTER — Telehealth: Payer: Self-pay | Admitting: Family Medicine

## 2023-12-23 NOTE — Telephone Encounter (Signed)
 Patient walked in to check status of medical records that she entered back in March 2025. Called medical records dept and they confirmed it was sent on 10/07/23 to [facility] clinic per her request and its being held up on their end. Medical records dept stated that they are faxing it back off today as of 12/23/23

## 2023-12-27 ENCOUNTER — Other Ambulatory Visit: Payer: Self-pay | Admitting: Internal Medicine

## 2023-12-27 DIAGNOSIS — Z1231 Encounter for screening mammogram for malignant neoplasm of breast: Secondary | ICD-10-CM

## 2024-02-02 ENCOUNTER — Other Ambulatory Visit: Payer: Self-pay

## 2024-02-02 ENCOUNTER — Telehealth: Payer: Self-pay | Admitting: Family Medicine

## 2024-02-02 DIAGNOSIS — J45909 Unspecified asthma, uncomplicated: Secondary | ICD-10-CM

## 2024-02-02 MED ORDER — MONTELUKAST SODIUM 10 MG PO TABS: 10.0000 mg | ORAL_TABLET | Freq: Every day | ORAL | 0 refills | Status: DC

## 2024-02-02 NOTE — Telephone Encounter (Signed)
 [facility] pharmacy faxed refill request for the following medications:    montelukast  (SINGULAIR ) 10 MG tablet   Please advise

## 2024-02-06 ENCOUNTER — Other Ambulatory Visit: Payer: Self-pay

## 2024-02-06 ENCOUNTER — Ambulatory Visit: Admitting: Urology

## 2024-02-06 VITALS — BP 118/74 | HR 83 | Ht 64.0 in | Wt 110.8 lb

## 2024-02-06 DIAGNOSIS — N200 Calculus of kidney: Secondary | ICD-10-CM

## 2024-02-06 DIAGNOSIS — N390 Urinary tract infection, site not specified: Secondary | ICD-10-CM | POA: Diagnosis not present

## 2024-02-06 DIAGNOSIS — J45909 Unspecified asthma, uncomplicated: Secondary | ICD-10-CM

## 2024-02-06 MED ORDER — MONTELUKAST SODIUM 10 MG PO TABS: 10.0000 mg | ORAL_TABLET | Freq: Every day | ORAL | 0 refills | Status: AC

## 2024-02-06 NOTE — Patient Instructions (Addendum)
 Please Call Scheduling to set up an appointment for your Ultrasound renal complete. [phone]

## 2024-02-06 NOTE — Progress Notes (Signed)
 02/06/24 3:34 PM   Brandy Hale 10-09-1965 [id number]  CC: Recurrent UTI, history of nephrolithiasis  HPI: 58 year old female here to establish care for the above issues.  She reportedly had some type of complex urology procedure when she was an infant, it sounds like this may have been a ureteral reimplant.  She thinks she has had at least 6 UTIs per year over most of her life, she also reported a kidney stone that she passed spontaneously in 2016.  She feels like she has to push to empty.  She was recently started on topical estrogen cream by GYN.   Renal ultrasound in 2016 showed minimal fullness of the right kidney, but no hydronephrosis, no stones.  Renal function normal with recent creatinine 0.6, EGFR greater than 60.   PMH: Past Medical History:  Diagnosis Date   Allergy    Asthma    Cancer (HCC)    melanoma   Insomnia     Surgical History: Past Surgical History:  Procedure Laterality Date   ABDOMINAL SURGERY     APPENDECTOMY     CYST EXCISION Left 11/2010   under armpit   EYE SURGERY     MELANOMA EXCISION  2008   OVARIAN CYST SURGERY  1985   SKIN BIOPSY      Family History: Family History  Problem Relation Age of Onset   Hypertension Mother    Hyperlipidemia Mother    Breast cancer Mother 28   Heart Problems Father    Hyperlipidemia Sister    Other Sister    Other Sister    Melanoma Sister     Social History:  reports that she has never smoked. She has never used smokeless tobacco. She reports current alcohol use. She reports that she does not use drugs.  Physical Exam: BP 118/74 (BP Location: Left Arm, Patient Position: Sitting, Cuff Size: Normal)   Pulse 83   Ht 5' 4 (1.626 m)   Wt 110 lb 12.8 oz (50.3 kg)   SpO2 99%   BMI 19.02 kg/m    Constitutional:  Alert and oriented, No acute distress. Cardiovascular: No clubbing, cyanosis, or edema. Respiratory: Normal respiratory effort, no increased work of breathing. GI: Abdomen is soft,  nontender, nondistended, no abdominal masses   Laboratory Data: Reviewed, see HPI   Assessment & Plan:   58 year old female with lifelong history of recurrent UTIs, reported history of complex urology procedure as an infant that sounds like this may have been a ureteral reimplant but none of those records are available to me.  We discussed the evaluation and treatment of patients with recurrent UTIs at length.  We specifically discussed the differences between asymptomatic bacteriuria and true urinary tract infection.  We discussed the AUA definition of recurrent UTI of at least 2 culture proven symptomatic acute cystitis episodes in a 61-month period, or 3 within a 1 year period.  We discussed the importance of culture directed antibiotic treatment, and antibiotic stewardship.  First-line therapy includes nitrofurantoin (5 days), Bactrim (3 days), or fosfomycin(3 g single dose).  Possible etiologies of recurrent infection include periurethral tissue atrophy in postmenopausal woman, constipation, sexual activity, incomplete emptying, anatomic abnormalities, and even genetic predisposition.  Finally, we discussed the role of perineal hygiene, timed voiding, adequate hydration, topical vaginal estrogen, cranberry prophylaxis, and low-dose antibiotic prophylaxis.  Cranberry tablets and topical estrogen cream for recurrent UTI Consider low-dose prophylaxis in the future, and/or cystoscopy Renal ultrasound ordered for further evaluation, call with results  [name], MD 02/06/2024  [facility] [address] [place], [place] [address] [phone]

## 2024-02-08 ENCOUNTER — Ambulatory Visit
Admission: RE | Admit: 2024-02-08 | Discharge: 2024-02-08 | Disposition: A | Discharge status: Home / Self Care | Source: Ambulatory Visit | Attending: Internal Medicine | Admitting: Internal Medicine

## 2024-02-08 DIAGNOSIS — Z1231 Encounter for screening mammogram for malignant neoplasm of breast: Secondary | ICD-10-CM | POA: Insufficient documentation

## 2024-02-13 ENCOUNTER — Ambulatory Visit
Admission: RE | Admit: 2024-02-13 | Discharge: 2024-02-13 | Disposition: A | Discharge status: Home / Self Care | Source: Ambulatory Visit | Attending: Urology | Admitting: Urology

## 2024-02-13 DIAGNOSIS — N390 Urinary tract infection, site not specified: Secondary | ICD-10-CM | POA: Insufficient documentation

## 2024-02-27 ENCOUNTER — Ambulatory Visit: Payer: Self-pay | Admitting: Urology

## 2024-02-27 NOTE — Telephone Encounter (Signed)
 Appt scheduled

## 2024-03-09 ENCOUNTER — Other Ambulatory Visit: Payer: Self-pay | Admitting: Family Medicine

## 2024-03-09 DIAGNOSIS — J45909 Unspecified asthma, uncomplicated: Secondary | ICD-10-CM

## 2024-03-12 NOTE — Telephone Encounter (Signed)
 Refusing due to patient has new PCP at [facility].  Requested Prescriptions  Pending Prescriptions Disp Refills   montelukast  (SINGULAIR ) 10 MG tablet [Pharmacy Med Name: Montelukast  Sodium 10 MG Oral Tablet] 30 tablet 11    Sig: TAKE 1 TABLET BY MOUTH DAILY     Pulmonology:  Leukotriene Inhibitors Failed - 03/12/2024  8:44 AM      Failed - Valid encounter within last 12 months    Recent Outpatient Visits   None     Future Appointments             In 1 week Maurine Lukes, PA-C [facility] [place]   In 2 months Maurine Lukes, [facility] [place]

## 2024-03-13 ENCOUNTER — Ambulatory Visit: Admitting: Physician Assistant

## 2024-03-19 ENCOUNTER — Ambulatory Visit: Admitting: Physician Assistant

## 2024-03-23 ENCOUNTER — Encounter: Payer: Self-pay | Admitting: Physician Assistant

## 2024-03-23 ENCOUNTER — Ambulatory Visit: Admitting: Physician Assistant

## 2024-03-23 VITALS — BP 123/80 | HR 83 | Ht 64.0 in | Wt 111.0 lb

## 2024-03-23 DIAGNOSIS — N390 Urinary tract infection, site not specified: Secondary | ICD-10-CM | POA: Diagnosis not present

## 2024-03-23 LAB — URINALYSIS, COMPLETE
Bilirubin, UA: NEGATIVE
Glucose, UA: NEGATIVE
Ketones, UA: NEGATIVE
Nitrite, UA: POSITIVE — AB
Protein,UA: NEGATIVE
Specific Gravity, UA: 1.01 (ref 1.005–1.030)
Urobilinogen, Ur: 0.2 mg/dL (ref 0.2–1.0)
pH, UA: 6.5 (ref 5.0–7.5)

## 2024-03-23 LAB — MICROSCOPIC EXAMINATION

## 2024-03-23 LAB — BLADDER SCAN AMB NON-IMAGING

## 2024-03-23 MED ORDER — AMOXICILLIN 875 MG PO TABS: 875.0000 mg | ORAL_TABLET | Freq: Two times a day (BID) | ORAL | 0 refills | Status: DC

## 2024-03-23 NOTE — Progress Notes (Signed)
 03/23/2024 11:35 AM   Brandy Hale 1966/01/06 [id number]  CC: Chief Complaint  Patient presents with   Urinary Tract Infection   HPI: Brandy Hale is a 58 y.o. 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   Today she reports a known clear duration of dysuria, frequency, cloudy urine, and malodorous urine.  She reports very frequent UTI with similar symptoms that resolved with culture appropriate antibiotics but typically recur within 2 weeks.  She continues to use vaginal estrogen cream, having started this within the last couple of months.  She admits to difficulty emptying her bladder.  Renal ultrasound dated 02/13/2024 showed small bladder diverticula with no hydronephrosis or shadowing stones.  In-office UA today positive for trace intact blood, nitrites, and 1+ leukocyte; urine microscopy with 11-30 WBCs/HPF, 3-10 RBCs/HPF, and many bacteria.  PVR 21 mL.  PMH: Past Medical History:  Diagnosis Date   Allergy    Asthma    Cancer (HCC)    melanoma   Insomnia     Surgical History: Past Surgical History:  Procedure Laterality Date   ABDOMINAL SURGERY     APPENDECTOMY     CYST EXCISION Left 11/2010   under armpit   EYE SURGERY     MELANOMA EXCISION  2008   OVARIAN CYST SURGERY  1985   SKIN BIOPSY      Home Medications:  Allergies as of 03/23/2024   No Known Allergies      Medication List        Accurate as of March 23, 2024 11:35 AM. If you have any questions, ask your nurse or doctor.          Biotin 10 MG Tabs Take by mouth.   Cranberry 1000 MG Caps Take by mouth.   meloxicam  15 MG tablet Commonly known as: MOBIC  Take 1 tablet (15 mg total) by mouth daily.   montelukast  10 MG tablet Commonly known as: SINGULAIR  Take 1 tablet (10 mg total) by mouth daily.   MULTIPLE VITAMIN PO Take 1  tablet by mouth daily.   tiZANidine  4 MG tablet Commonly known as: ZANAFLEX  TAKE 1 TABLET BY MOUTH EVERY 6 HOURS AS NEEDED FOR MUSCLE SPASMS.        Allergies:  No Known Allergies  Family History: Family History  Problem Relation Age of Onset   Hypertension Mother    Hyperlipidemia Mother    Breast cancer Mother 32   Heart Problems Father    Hyperlipidemia Sister    Other Sister    Other Sister    Melanoma Sister     Social History:   reports that she has never smoked. She has never used smokeless tobacco. She reports current alcohol use. She reports that she does not use drugs.  Physical Exam: BP 123/80   Pulse 83   Ht 5' 4 (1.626 m)   Wt 111 lb (50.3 kg)   BMI 19.05 kg/m   Constitutional:  Alert and oriented, no acute distress, nontoxic appearing HEENT: [place], AT Cardiovascular: No clubbing, cyanosis, or edema Respiratory: Normal respiratory effort, no increased work of breathing Skin: No rashes, bruises or suspicious lesions Neurologic: Grossly intact, no focal deficits, moving all 4 extremities Psychiatric: Normal mood and affect  Laboratory Data: Results for orders placed or performed in visit on 03/23/24  Microscopic Examination   Collection Time: 03/23/24 11:18 AM   Urine  Result Value Ref Range   WBC, UA 11-30 (A) 0 - 5 /hpf   RBC, Urine 3-10 (A) 0 - 2 /hpf   Epithelial Cells (non renal) 0-10 0 - 10 /hpf   Bacteria, UA Many (A) None seen/Few  Urinalysis, Complete   Collection Time: 03/23/24 11:18 AM  Result Value Ref Range   Specific Gravity, UA 1.010 1.005 - 1.030   pH, UA 6.5 5.0 - 7.5   Color, UA Yellow Yellow   Appearance Ur Slightly cloudy Clear   Leukocytes,UA 1+ (A) Negative   Protein,UA Negative Negative/Trace   Glucose, UA Negative Negative   Ketones, UA Negative Negative   RBC, UA Trace (A) Negative   Bilirubin, UA Negative Negative   Urobilinogen, Ur 0.2 0.2 - 1.0 mg/dL   Nitrite, UA Positive (A) Negative   Microscopic Examination  See below:   BLADDER SCAN AMB NON-IMAGING   Collection Time: 03/23/24 11:33 AM  Result Value Ref Range   Scan Result 21ml   CULTURE, URINE COMPREHENSIVE   Collection Time: 03/23/24 11:50 AM   Specimen: Urine   UR  Result Value Ref Range   Urine Culture, Comprehensive Final report (A)    Organism ID, Bacteria Aerococcus urinae (A)    Assessment & Plan:   1. Recurrent UTI (Primary) UA appears grossly infected, will start empiric amoxicillin  and send for culture for further evaluation.  Will treat for 7 days in light of her bladder diverticula, as these could be contributory to frequent infections.  I recommended cystoscopy, question possible urethral stricture as contributory to her diverticula and difficulty voiding.  We discussed that she is emptying appropriately today despite her difficulty voiding.  I encouraged her to continue estrogen cream and we discussed that this can take 3 months of consistent use before it makes a difference. - Urinalysis, Complete - BLADDER SCAN AMB NON-IMAGING - CULTURE, URINE COMPREHENSIVE - amoxicillin  (AMOXIL ) 875 MG tablet; Take 1 tablet (875 mg total) by mouth every 12 (twelve) hours.  Dispense: 14 tablet; Refill: 0   Return in about 4 weeks (around 04/20/2024) for Cysto with Dr. Francisca.  Lucie Hones, PA-C  [facility] [place] [address], [address] [place], [place] [address] [phone]

## 2024-03-23 NOTE — Patient Instructions (Signed)

## 2024-03-27 ENCOUNTER — Ambulatory Visit: Admitting: Urology

## 2024-03-27 ENCOUNTER — Other Ambulatory Visit: Payer: Self-pay | Admitting: Urology

## 2024-03-27 VITALS — BP 127/74 | HR 87

## 2024-03-27 DIAGNOSIS — N390 Urinary tract infection, site not specified: Secondary | ICD-10-CM | POA: Diagnosis not present

## 2024-03-27 MED ORDER — LIDOCAINE HCL URETHRAL/MUCOSAL 2 % EX GEL
1.0000 | Freq: Once | CUTANEOUS | Status: AC
  Administered 2024-03-27: 1 via URETHRAL

## 2024-03-27 MED ORDER — NITROFURANTOIN MONOHYD MACRO 100 MG PO CAPS
100.0000 mg | ORAL_CAPSULE | Freq: Every day | ORAL | 0 refills | Status: DC
Start: 2024-03-27 — End: 2024-05-29

## 2024-03-27 NOTE — Progress Notes (Signed)
 Cystoscopy Procedure Note:  Indication: Recurrent UTI  Recently treated for UTI, culture still pending but symptoms have resolved  After informed consent and discussion of the procedure and its risks, Brandy Hale was positioned and prepped in the standard fashion. Cystoscopy was performed with a flexible cystoscope. The urethra, bladder neck and entire bladder was visualized in a standard fashion. The ureteral orifices were visualized in their normal location and orientation.  Bladder mucosa grossly normal throughout, there were 2 small patches of whitish tissue that appeared more consistent with recent infection, did not appear concerning for malignancy.  Directed cytology sent.  Imaging: Renal/bladder ultrasound in August 2025 benign aside from small bladder diverticula  Findings: Essentially normal cystoscopy, 2 small white patches concerning for some possible inflammation after recent infection, but no tumors  Assessment and Plan: Follow-up cytology Continue topical estrogen cream, added 90 days nitrofurantoin  prophylaxis  [name], MD 03/27/2024

## 2024-03-28 LAB — CULTURE, URINE COMPREHENSIVE

## 2024-03-30 ENCOUNTER — Telehealth: Payer: Self-pay

## 2024-03-30 NOTE — Telephone Encounter (Signed)
 Brandy Hale with [facility] left message on triage line-   He received a urine cytology specimen that does not have any identification.   Note is clear that [facility] wanted pt to have cytology. OG not in house for me to confirm. Per Brandy Hale ok for specimen to wait until Tuesday.   Was sent on paper req??? Why not ordered via Epic??  Will send to OG to verify.

## 2024-04-03 NOTE — Telephone Encounter (Signed)
 Cytology verified, verification form faxed to Bill at [facility].

## 2024-04-05 ENCOUNTER — Ambulatory Visit: Payer: Self-pay | Admitting: Urology

## 2024-04-05 LAB — CYTOLOGY PLUS MONITORING PROFILE: PAP & FEULGEN

## 2024-05-29 ENCOUNTER — Ambulatory Visit: Admitting: Physician Assistant

## 2024-05-29 VITALS — BP 114/72 | HR 88 | Ht 64.0 in | Wt 109.0 lb

## 2024-05-29 DIAGNOSIS — N390 Urinary tract infection, site not specified: Secondary | ICD-10-CM

## 2024-05-29 MED ORDER — NITROFURANTOIN MONOHYD MACRO 100 MG PO CAPS: 100.0000 mg | ORAL_CAPSULE | Freq: Every day | ORAL | 0 refills | Status: AC

## 2024-05-29 NOTE — Progress Notes (Signed)
 05/29/2024 1:45 PM   Brandy Hale 01/20/1966 [id number]  CC: Chief Complaint  Patient presents with   Follow-up   Recurrent UTI   HPI: Brandy Hale is a 58 y.o. 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  She had a cystoscopy with Dr. Francisca on 03/27/2024.  Findings were benign, there were 2 small white patches concerning for possible postinfectious inflammation.  Urine cytology was benign.  He started her on suppressive Macrobid .  Today she reports she remains on daily suppressive Macrobid , nearly daily estrogen cream, and cranberry supplements and overall feels she is doing better.  She has been UTI free since she saw Dr. Francisca 2 months ago.  She would like to continue her current regimen.  She is asymptomatic of UTI today.  PMH: Past Medical History:  Diagnosis Date   Allergy    Asthma    Cancer (HCC)    melanoma   Insomnia     Surgical History: Past Surgical History:  Procedure Laterality Date   ABDOMINAL SURGERY     APPENDECTOMY     CYST EXCISION Left 11/2010   under armpit   EYE SURGERY     MELANOMA EXCISION  2008   OVARIAN CYST SURGERY  1985   SKIN BIOPSY      Home Medications:  Allergies as of 05/29/2024   No Known Allergies      Medication List        Accurate as of May 29, 2024  1:45 PM. If you have any questions, ask your nurse or doctor.          STOP taking these medications    amoxicillin  875 MG tablet Commonly known as: AMOXIL    meloxicam  15 MG tablet Commonly known as: MOBIC    saccharomyces boulardii 250 MG capsule Commonly known as: FLORASTOR       TAKE these medications    Biotin 10 MG Tabs Take by mouth.   Cranberry 1000 MG Caps Take by mouth.   estradiol 0.1 MG/GM vaginal cream Commonly known as: ESTRACE Place pea-sized amount of cream vaginally every night for  2 weeks then twice weekly   methocarbamol 500 MG tablet Commonly known as: ROBAXIN Take 500 mg by mouth at bedtime.   montelukast  10 MG tablet Commonly known as: SINGULAIR  Take 1 tablet (10 mg total) by mouth daily.   MULTIPLE VITAMIN PO Take 1 tablet by mouth daily.   nitrofurantoin  (macrocrystal-monohydrate) 100 MG capsule Commonly known as: MACROBID  Take 1 capsule (100 mg total) by mouth daily.   tiZANidine  4 MG tablet Commonly known as: ZANAFLEX  TAKE 1 TABLET BY MOUTH EVERY 6 HOURS AS NEEDED FOR MUSCLE SPASMS.   zolpidem  10 MG tablet Commonly known as: AMBIEN  Take 10 mg by mouth at bedtime as needed for sleep.        Allergies:  No Known Allergies  Family History: Family History  Problem Relation Age of Onset   Hypertension Mother    Hyperlipidemia Mother    Breast cancer Mother 80   Heart Problems Father    Hyperlipidemia Sister    Other Sister    Other Sister    Melanoma Sister     Social History:   reports that she has never smoked. She has never used smokeless tobacco. She reports current alcohol use. She reports that she does not use drugs.  Physical Exam: BP 114/72 (  BP Location: Left Arm, Patient Position: Sitting, Cuff Size: Normal)   Pulse 88   Ht 5' 4 (1.626 m)   Wt 109 lb (49.4 kg)   SpO2 98%   BMI 18.71 kg/m   Constitutional:  Alert and oriented, no acute distress, nontoxic appearing HEENT: [place], AT Cardiovascular: No clubbing, cyanosis, or edema Respiratory: Normal respiratory effort, no increased work of breathing Skin: No rashes, bruises or suspicious lesions Neurologic: Grossly intact, no focal deficits, moving all 4 extremities Psychiatric: Normal mood and affect  Assessment & Plan:   1. Recurrent UTI (Primary) Infection free on vaginal estrogen cream, cranberry supplements, and daily Macrobid .  Will continue this regimen.  We discussed continuing Macrobid  and we mutually agreed to extend an additional 3 months for total of 6 months  of therapy.  At that point, we can discuss via MyChart whether or not to continue versus stop therapy. - nitrofurantoin , macrocrystal-monohydrate, (MACROBID ) 100 MG capsule; Take 1 capsule (100 mg total) by mouth daily.  Dispense: 90 capsule; Refill: 0   Return in about 6 months (around 11/26/2024) for rUTI follow up.  Lucie Hones, PA-C  [facility] [place] [address] [place], [place] [address] [phone]

## 2024-06-21 ENCOUNTER — Other Ambulatory Visit: Payer: Self-pay | Admitting: Urology

## 2024-06-21 DIAGNOSIS — N390 Urinary tract infection, site not specified: Secondary | ICD-10-CM

## 2024-11-26 ENCOUNTER — Ambulatory Visit: Admitting: Physician Assistant
